# Patient Record
Sex: Female | Born: 1935 | Race: White | Hispanic: No | Smoking: Former smoker
Health system: Southern US, Community
[De-identification: ages and names within clinical notes are randomized; demographics above are authoritative.]

## PROBLEM LIST (undated history)

## (undated) DIAGNOSIS — I10 Essential (primary) hypertension: Secondary | ICD-10-CM

## (undated) DIAGNOSIS — Z972 Presence of dental prosthetic device (complete) (partial): Secondary | ICD-10-CM

## (undated) DIAGNOSIS — N289 Disorder of kidney and ureter, unspecified: Secondary | ICD-10-CM

## (undated) DIAGNOSIS — M1990 Unspecified osteoarthritis, unspecified site: Secondary | ICD-10-CM

## (undated) DIAGNOSIS — Z973 Presence of spectacles and contact lenses: Secondary | ICD-10-CM

## (undated) DIAGNOSIS — K08109 Complete loss of teeth, unspecified cause, unspecified class: Secondary | ICD-10-CM

## (undated) DIAGNOSIS — I4891 Unspecified atrial fibrillation: Secondary | ICD-10-CM

## (undated) DIAGNOSIS — E7800 Pure hypercholesterolemia, unspecified: Secondary | ICD-10-CM

## (undated) HISTORY — PX: APPENDECTOMY: SHX54

## (undated) HISTORY — PX: ABDOMINAL HYSTERECTOMY: SHX81

## (undated) HISTORY — PX: EYE SURGERY: SHX253

## (undated) HISTORY — PX: CATARACT EXTRACTION: SUR2

## (undated) HISTORY — PX: TONSILLECTOMY: SUR1361

---

## 1983-05-24 HISTORY — PX: CARPAL TUNNEL RELEASE: SHX101

## 1984-05-23 HISTORY — PX: ORIF FEMUR FRACTURE: SHX2119

## 1984-05-23 HISTORY — PX: ORIF RADIUS & ULNA FRACTURES: SHX2129

## 2007-02-26 ENCOUNTER — Ambulatory Visit (HOSPITAL_COMMUNITY): Admission: RE | Admit: 2007-02-26 | Discharge: 2007-02-26 | Payer: Self-pay | Admitting: Ophthalmology

## 2008-12-23 ENCOUNTER — Ambulatory Visit (HOSPITAL_COMMUNITY): Admission: RE | Admit: 2008-12-23 | Discharge: 2008-12-24 | Payer: Self-pay | Admitting: Family Medicine

## 2008-12-26 ENCOUNTER — Ambulatory Visit: Payer: Self-pay | Admitting: Cardiology

## 2009-06-14 ENCOUNTER — Emergency Department (HOSPITAL_COMMUNITY): Admission: EM | Admit: 2009-06-14 | Discharge: 2009-06-14 | Payer: Self-pay | Admitting: Emergency Medicine

## 2009-12-22 ENCOUNTER — Emergency Department (HOSPITAL_COMMUNITY)
Admission: EM | Admit: 2009-12-22 | Discharge: 2009-12-22 | Payer: Self-pay | Source: Home / Self Care | Admitting: Emergency Medicine

## 2010-08-06 LAB — APTT: aPTT: 34 seconds (ref 24–37)

## 2010-08-06 LAB — PROTIME-INR: Prothrombin Time: 25.7 seconds — ABNORMAL HIGH (ref 11.6–15.2)

## 2010-10-05 NOTE — Op Note (Signed)
NAMESANORA, Cynthia James          ACCOUNT NO.:  [id number]   MEDICAL RECORD NO.:  [id number]          PATIENT TYPE:  AMB   LOCATION:  SDS                          FACILITY:  [facility]   PHYSICIAN:  Jonetta Speak, M.D. DATE OF BIRTH:  Mar 21, 1930   DATE OF PROCEDURE:  02/26/2007  DATE OF DISCHARGE:                               OPERATIVE REPORT   PREOPERATIVE DIAGNOSIS:  Retained lens material, right eye with  increased intraocular pressure.   POSTOPERATIVE DIAGNOSIS:  Retained lens material, right eye with  increased intraocular pressure.   OPERATION/PROCEDURE:  Pars plana vitrectomy and phacoemulsification of  retained nuclear and cortical lens fragments.   SURGEON:  Jonetta Speak, M.D.   ASSISTANT:  Duane Lope, LPN.   ANESTHESIA:  General tracheal.   ESTIMATED BLOOD LOSS:  Less than 1 mL.   COMPLICATIONS:  None.   OPERATIVE NOTE:  The patient was taken to the operating room where after  induction of general anesthesia, the right eye was prepped and draped in  the  usual fashion.  Lid speculum was introduced and conjunctival probe  was developed over the superior 270 degrees.  Hemostasis obtained with  eraser cautery.  Sclerotomies were fashioned 3 mm posterior to the  limbus at 1:30, 10:30 and 7:30.  The superior sclerotomies were plugged  and 4 mm infusion cannula secured with 7:30 sclerotomy with a temporary  suture of 7-0 Vicryl.  The tip of the infusion cannula was visually  inspected and found to be in good position.  Landers ring was then  secured to the globe with 7-0 Vicryl sutures at 3 and 9.  The plugs were  removed and 30-degree prismatic lens applied to the surface of the eye.  Using a vitrector, central core followed by peripheral vitrectomy was  performed.  There were several nuclear phlegmons, both small and large,  with surrounding cortex in the inferior vitreous cavity.  The vitrector  was used to shave off the vitreous from around these fragments.   The  fragments were seen just slight posteriorly.  Scleral depression was  used to further trim the inferior vitreous break.  No retinal breaks or  iatrogenic breaks or tears were seen.  The IOL was inspected and found  be in good position with some cortex in the peripheral bag.  A small  amount of peripheral cortex was removed with the vitrector.  Care was  made not to interrupt the integrity of the anterior capsule and risk  dislocating the IOL.  A flat lens then applied to the surface.  Using  the phaco, the fragment was removed, the nuclear fragments, both small  and moderate size, were able to be phacoemulsified and removed from the  eye without difficulty.  The 30-degree prismatic lens was reapplied to  surface.  Inspection of the peripheral retina revealed there to be no  additional lens fragments.  The vitreous was checked and found to be  separated with no suggestion of the posterior segment.  Instruments were  removed from the eye and the  Small holes plugged.  Landers lens ring  removed.  Ophthalmoscope and scleral depression  350 degrees revealed  there to be no peripheral retinal breaks or tears.  The superior  sclerotomies were then closed with 7-0 Vicryl.  The infusion cannulas  removed and preplaced sutures secured.  Conjunctiva was drawn and  reapproximated with interrupted running suture of 6-0 plain gut.  The  pressure checked Baer keratometer at 63.  Subconjunctival space was  irrigated with 0.75% Marcaine followed by  subconjunctival injection of 100 mg of 92 mg of ceftazidime and 10 mg of  Decadron.  The lid speculum then removed and mixed antibiotic ointment  applied to the surface.  Patch and shield were placed over the patient's  right eye.  Upon waking from anesthesia, the patient left the operating  in stable condition.           ______________________________  Jonetta Speak, M.D.     JTH/MEDQ  D:  02/26/2007  T:  02/26/2007  Job:  [id number]   cc:   Dr.  __________

## 2011-03-03 LAB — DIFFERENTIAL
Basophils Absolute: 0.1
Eosinophils Absolute: 0.2
Lymphocytes Relative: 31
Lymphs Abs: 1.6
Monocytes Absolute: 0.5
Monocytes Relative: 10
Neutro Abs: 2.8
Neutrophils Relative %: 54

## 2011-03-03 LAB — COMPREHENSIVE METABOLIC PANEL
AST: 18
BUN: 61 — ABNORMAL HIGH
CO2: 21
Calcium: 8.3 — ABNORMAL LOW
Creatinine, Ser: 2.17 — ABNORMAL HIGH
GFR calc Af Amer: 27 — ABNORMAL LOW
Glucose, Bld: 131 — ABNORMAL HIGH
Total Protein: 6.3

## 2011-03-03 LAB — URINE MICROSCOPIC-ADD ON

## 2011-03-03 LAB — URINALYSIS, ROUTINE W REFLEX MICROSCOPIC
Glucose, UA: NEGATIVE
Hgb urine dipstick: NEGATIVE
Ketones, ur: NEGATIVE
Specific Gravity, Urine: 1.016
Urobilinogen, UA: 0.2

## 2011-03-03 LAB — CBC
HCT: 29.5 — ABNORMAL LOW
Platelets: 260
RBC: 3.25 — ABNORMAL LOW
WBC: 5.3

## 2011-08-11 ENCOUNTER — Encounter (HOSPITAL_COMMUNITY): Payer: Self-pay | Admitting: Emergency Medicine

## 2011-08-11 ENCOUNTER — Other Ambulatory Visit: Payer: Self-pay

## 2011-08-11 ENCOUNTER — Emergency Department (HOSPITAL_COMMUNITY)
Admission: EM | Admit: 2011-08-11 | Discharge: 2011-08-11 | Disposition: A | Discharge status: Home / Self Care | Payer: Medicare Other | Attending: Emergency Medicine | Admitting: Emergency Medicine

## 2011-08-11 ENCOUNTER — Emergency Department (HOSPITAL_COMMUNITY): Payer: Medicare Other

## 2011-08-11 DIAGNOSIS — R0989 Other specified symptoms and signs involving the circulatory and respiratory systems: Secondary | ICD-10-CM | POA: Insufficient documentation

## 2011-08-11 DIAGNOSIS — R079 Chest pain, unspecified: Secondary | ICD-10-CM | POA: Insufficient documentation

## 2011-08-11 DIAGNOSIS — Z79899 Other long term (current) drug therapy: Secondary | ICD-10-CM | POA: Insufficient documentation

## 2011-08-11 DIAGNOSIS — R0609 Other forms of dyspnea: Secondary | ICD-10-CM | POA: Insufficient documentation

## 2011-08-11 DIAGNOSIS — I499 Cardiac arrhythmia, unspecified: Secondary | ICD-10-CM | POA: Insufficient documentation

## 2011-08-11 HISTORY — DX: Unspecified atrial fibrillation: I48.91

## 2011-08-11 HISTORY — DX: Pure hypercholesterolemia, unspecified: E78.00

## 2011-08-11 HISTORY — DX: Essential (primary) hypertension: I10

## 2011-08-11 HISTORY — DX: Unspecified osteoarthritis, unspecified site: M19.90

## 2011-08-11 HISTORY — DX: Disorder of kidney and ureter, unspecified: N28.9

## 2011-08-11 LAB — CBC
HCT: 38.2 % (ref 36.0–46.0)
Hemoglobin: 12.5 g/dL (ref 12.0–15.0)
MCH: 29.8 pg (ref 26.0–34.0)
MCHC: 32.7 g/dL (ref 30.0–36.0)
MCV: 91 fL (ref 78.0–100.0)
Platelets: 298 10*3/uL (ref 150–400)
RBC: 4.2 MIL/uL (ref 3.87–5.11)
RDW: 14.2 % (ref 11.5–15.5)
WBC: 9.3 10*3/uL (ref 4.0–10.5)

## 2011-08-11 LAB — BASIC METABOLIC PANEL
BUN: 36 mg/dL — ABNORMAL HIGH (ref 6–23)
CO2: 24 mEq/L (ref 19–32)
Calcium: 9.5 mg/dL (ref 8.4–10.5)
Chloride: 104 mEq/L (ref 96–112)
Creatinine, Ser: 1.6 mg/dL — ABNORMAL HIGH (ref 0.50–1.10)
GFR calc Af Amer: 34 mL/min — ABNORMAL LOW (ref >90)
GFR calc non Af Amer: 30 mL/min — ABNORMAL LOW (ref >90)
Glucose, Bld: 80 mg/dL (ref 70–99)
Potassium: 4.2 mEq/L (ref 3.5–5.1)
Sodium: 140 mEq/L (ref 135–145)

## 2011-08-11 LAB — POCT I-STAT TROPONIN I: Troponin i, poc: 0 ng/mL (ref 0.00–0.08)

## 2011-08-11 MED ORDER — ASPIRIN 81 MG PO CHEW
324.0000 mg | CHEWABLE_TABLET | Freq: Once | ORAL | Status: DC
  Filled 2011-08-11: qty 4

## 2011-08-11 NOTE — ED Provider Notes (Signed)
History    78 year old female referred to the ER for evaluation of chest pain. Patient states she was at  her doctor's office today patient because she was having some difficulty with her at home INR machine. Patient was in the office when she was asked how she was doing to which she replied that she has not been feeling well recently. She explained that she's been having some intermittent chest pain as well. Onset several weeks ago. Pain is intermittent in the center her chest. Describes as sharp. Lasts seconds.  Does not radiate. Patient is also noticed that she sometimes gets the pain when she moves her left shoulder when she's driving her car. No other appreciable exacerbating relieving factors. No shortness of breath. No nausea, diaphoresis or palpitations. Patient has a history of atrial fibrillation which she is on coumadin.  INR checked today and 2.8. She is also hypertensive and has history of hyperlipidemia. Former smoker.   CSN: [id number]  Arrival date & time 08/11/11  23   First MD Initiated Contact with Patient 08/11/11 1605      Chief Complaint  Patient presents with  . Chest Pain    (Consider location/radiation/quality/duration/timing/severity/associated sxs/prior treatment) HPI  Past Medical History  Diagnosis Date  . Arthritis   . Diabetes mellitus   . Hypertension   . Renal disorder   . Osteoporosis   . High cholesterol   . A-fib     Past Surgical History  Procedure Date  . Tonsillectomy   . Cataract extraction     bilateral  . Abdominal hysterectomy   . Orif femur fracture   . Orif radius & ulna fractures     right  . Carpal tunnel release     Family History  Problem Relation Age of Onset  . Diabetes Mother   . Heart failure Father   . Diabetes Brother   . Cancer Brother     History  Substance Use Topics  . Smoking status: Former Smoker -- 1.5 packs/day for 20 years    Types: Cigarettes    Quit date: 08/11/1994  . Smokeless tobacco: Never  Used  . Alcohol Use: No    OB History    Grav Para Term Preterm Abortions TAB SAB Ect Mult Living   4 3 3  1  1   3       Review of Systems  Review of symptoms negative unless otherwise noted in HPI.   Allergies  Codeine  Home Medications  No current outpatient prescriptions on file.  BP 157/73  Pulse 80  Temp(Src) 97.9 F (36.6 C) (Oral)  Resp 20  Ht 4\' 11"  (1.499 m)  Wt 196 lb (88.905 kg)  BMI 39.59 kg/m2  SpO2 99%  Physical Exam  Nursing note and vitals reviewed. Constitutional: She appears well-developed and well-nourished. No distress.       obese  HENT:  Head: Normocephalic and atraumatic.  Eyes: Conjunctivae are normal. Pupils are equal, round, and reactive to light. Right eye exhibits no discharge. Left eye exhibits no discharge.  Neck: Neck supple.  Cardiovascular: Normal heart sounds.  Exam reveals no gallop and no friction rub.   No murmur heard.      Irregularly irregular  Pulmonary/Chest: Effort normal and breath sounds normal. No respiratory distress.  Abdominal: Soft. She exhibits no distension. There is no tenderness.  Musculoskeletal: She exhibits no edema and no tenderness.  Neurological: She is alert.  Skin: Skin is warm. She is not diaphoretic.  Psychiatric: She  has a normal mood and affect. Her behavior is normal. Thought content normal.    ED Course  Procedures (including critical care time)  Labs Reviewed  BASIC METABOLIC PANEL - Abnormal; Notable for the following:    BUN 36 (*)    Creatinine, Ser 1.60 (*)    GFR calc non Af Amer 30 (*)    GFR calc Af Amer 34 (*)    All other components within normal limits  CBC  POCT I-STAT TROPONIN I  LAB REPORT - SCANNED   No results found.  EKG:  Rhythm: a fib Rate: 80 Axis:normal Intervals: aside from fib, normal ST segments: normal Comparison: None   1. Chest pain       MDM  -year-old female chest pain. Consider ACS but doubt, although patient does have multiple risk factors  for coronary artery disease. Pain is atypical given very brief duration of several seconds and sometimes elicited with movement. EKG with atrial fibrillation but patient has a history of this. There are no significant ST changes. Troponin is normal. CBC is unremarkable. Creatinine is elevated but suspect that this is somewhat chronic in nature. Improved from prior comparison. No infectious symptoms. Consider pulmonary embolism but doubt. Low suspicion for urgent or emergent etiology of patient's complaints. Patient is safe for discharge at this time. Return precautions were discussed. Outpatient followup.       Virgel Manifold, MD 08/26/11 [phone]

## 2011-08-11 NOTE — ED Notes (Signed)
Patient doesn't want to take aspirin due to her taking coumadin. Per patient INR was 2.8 today. EDP made aware-aspirin discontinued.

## 2011-08-11 NOTE — Discharge Instructions (Signed)
Chest Pain (Nonspecific) It is often hard to give a specific diagnosis for the cause of chest pain. There is always a chance that your pain could be related to something serious, such as a heart attack or a blood clot in the lungs. You need to follow up with your caregiver for further evaluation. CAUSES   Heartburn.   Pneumonia or bronchitis.   Anxiety or stress.   Inflammation around your heart (pericarditis) or lung (pleuritis or pleurisy).   A blood clot in the lung.   A collapsed lung (pneumothorax). It can develop suddenly on its own (spontaneous pneumothorax) or from injury (trauma) to the chest.   Shingles infection (herpes zoster virus).  The chest wall is composed of bones, muscles, and cartilage. Any of these can be the source of the pain.  The bones can be bruised by injury.   The muscles or cartilage can be strained by coughing or overwork.   The cartilage can be affected by inflammation and become sore (costochondritis).  DIAGNOSIS  Lab tests or other studies, such as X-rays, electrocardiography, stress testing, or cardiac imaging, may be needed to find the cause of your pain.  TREATMENT   Treatment depends on what may be causing your chest pain. Treatment may include:   Acid blockers for heartburn.   Anti-inflammatory medicine.   Pain medicine for inflammatory conditions.   Antibiotics if an infection is present.   You may be advised to change lifestyle habits. This includes stopping smoking and avoiding alcohol, caffeine, and chocolate.   You may be advised to keep your head raised (elevated) when sleeping. This reduces the chance of acid going backward from your stomach into your esophagus.   Most of the time, nonspecific chest pain will improve within 2 to 3 days with rest and mild pain medicine.  HOME CARE INSTRUCTIONS   If antibiotics were prescribed, take your antibiotics as directed. Finish them even if you start to feel better.   For the next few  days, avoid physical activities that bring on chest pain. Continue physical activities as directed.   Do not smoke.   Avoid drinking alcohol.   Only take over-the-counter or prescription medicine for pain, discomfort, or fever as directed by your caregiver.   Follow your caregiver's suggestions for further testing if your chest pain does not go away.   Keep any follow-up appointments you made. If you do not go to an appointment, you could develop lasting (chronic) problems with pain. If there is any problem keeping an appointment, you must call to reschedule.  SEEK MEDICAL CARE IF:   You think you are having problems from the medicine you are taking. Read your medicine instructions carefully.   Your chest pain does not go away, even after treatment.   You develop a rash with blisters on your chest.  SEEK IMMEDIATE MEDICAL CARE IF:   You have increased chest pain or pain that spreads to your arm, neck, jaw, back, or abdomen.   You develop shortness of breath, an increasing cough, or you are coughing up blood.   You have severe back or abdominal pain, feel nauseous, or vomit.   You develop severe weakness, fainting, or chills.   You have a fever.  THIS IS AN EMERGENCY. Do not wait to see if the pain will go away. Get medical help at once. Call your local emergency services (911 in [place] Do not drive yourself to the hospital. MAKE SURE YOU:   Understand these instructions.     Will watch your condition.   Will get help right away if you are not doing well or get worse.  Document Released: 02/16/2005 Document Revised: 04/28/2011 Document Reviewed: 12/13/2007 [employer] Patient Information 2012 [employer]. 

## 2011-08-11 NOTE — ED Notes (Signed)
Patient in via EMS from [facility] for nonradiating left sided chest pain. Patient states "I just felt bad today and mentioned I had chest pain off and on and they sent me over here." Patient reports some shortness of breath, dizziness or nausea.

## 2012-08-15 ENCOUNTER — Emergency Department (HOSPITAL_COMMUNITY)
Admission: EM | Admit: 2012-08-15 | Discharge: 2012-08-15 | Disposition: A | Discharge status: Home / Self Care | Payer: Medicare Other | Attending: Emergency Medicine | Admitting: Emergency Medicine

## 2012-08-15 ENCOUNTER — Encounter (HOSPITAL_COMMUNITY): Payer: Self-pay | Admitting: Emergency Medicine

## 2012-08-15 ENCOUNTER — Emergency Department (HOSPITAL_COMMUNITY): Payer: Medicare Other

## 2012-08-15 DIAGNOSIS — M7989 Other specified soft tissue disorders: Secondary | ICD-10-CM | POA: Insufficient documentation

## 2012-08-15 DIAGNOSIS — R0602 Shortness of breath: Secondary | ICD-10-CM | POA: Insufficient documentation

## 2012-08-15 DIAGNOSIS — Z79899 Other long term (current) drug therapy: Secondary | ICD-10-CM | POA: Insufficient documentation

## 2012-08-15 DIAGNOSIS — Z87891 Personal history of nicotine dependence: Secondary | ICD-10-CM | POA: Insufficient documentation

## 2012-08-15 DIAGNOSIS — E119 Type 2 diabetes mellitus without complications: Secondary | ICD-10-CM | POA: Insufficient documentation

## 2012-08-15 DIAGNOSIS — I10 Essential (primary) hypertension: Secondary | ICD-10-CM | POA: Insufficient documentation

## 2012-08-15 DIAGNOSIS — Z7901 Long term (current) use of anticoagulants: Secondary | ICD-10-CM | POA: Insufficient documentation

## 2012-08-15 DIAGNOSIS — I4891 Unspecified atrial fibrillation: Secondary | ICD-10-CM | POA: Insufficient documentation

## 2012-08-15 DIAGNOSIS — Z8739 Personal history of other diseases of the musculoskeletal system and connective tissue: Secondary | ICD-10-CM | POA: Insufficient documentation

## 2012-08-15 DIAGNOSIS — Z87448 Personal history of other diseases of urinary system: Secondary | ICD-10-CM | POA: Insufficient documentation

## 2012-08-15 DIAGNOSIS — R079 Chest pain, unspecified: Secondary | ICD-10-CM

## 2012-08-15 DIAGNOSIS — R5381 Other malaise: Secondary | ICD-10-CM | POA: Insufficient documentation

## 2012-08-15 DIAGNOSIS — E7800 Pure hypercholesterolemia, unspecified: Secondary | ICD-10-CM | POA: Insufficient documentation

## 2012-08-15 LAB — BASIC METABOLIC PANEL
CO2: 27 mEq/L (ref 19–32)
Chloride: 105 mEq/L (ref 96–112)
Creatinine, Ser: 1.38 mg/dL — ABNORMAL HIGH (ref 0.50–1.10)

## 2012-08-15 LAB — CBC
Hemoglobin: 12.9 g/dL (ref 12.0–15.0)
MCHC: 32.6 g/dL (ref 30.0–36.0)
Platelets: 266 10*3/uL (ref 150–400)
RDW: 13.2 % (ref 11.5–15.5)

## 2012-08-15 LAB — GLUCOSE, CAPILLARY: Glucose-Capillary: 190 mg/dL — ABNORMAL HIGH (ref 70–99)

## 2012-08-15 LAB — PROTIME-INR
INR: 2.14 — ABNORMAL HIGH (ref 0.00–1.49)
Prothrombin Time: 23 seconds — ABNORMAL HIGH (ref 11.6–15.2)

## 2012-08-15 LAB — TROPONIN I: Troponin I: <0.3 ng/mL (ref <0.30)

## 2012-08-15 MED ORDER — NITROGLYCERIN 0.4 MG SL SUBL
0.4000 mg | SUBLINGUAL_TABLET | Freq: Once | SUBLINGUAL | Status: DC
  Filled 2012-08-15: qty 25

## 2012-08-15 MED ORDER — ASPIRIN 325 MG PO TABS
325.0000 mg | ORAL_TABLET | Freq: Once | ORAL | Status: AC
  Administered 2012-08-15: 325 mg via ORAL
  Filled 2012-08-15: qty 1

## 2012-08-15 NOTE — ED Notes (Signed)
Pt ambulatory to restroom with steady gait with stand by assist.

## 2012-08-15 NOTE — ED Provider Notes (Signed)
History  This chart was scribed for Cynthia Morn, MD by Cynthia James, ED Scribe. This patient was seen in room APA16A/APA16A and the patient's care was started at 9:49 AM.  CSN: [id number]  Arrival date & time 08/15/12  0930   First MD Initiated Contact with Patient 08/15/12 [phone]      Chief Complaint  Patient presents with  . Irregular Heart Beat  . Weakness    The history is provided by the patient. No language interpreter was used.    Cynthia James is a 79 y.o. female who presents to the Emergency Department complaining of one week of gradual onset, gradually worsening, constant fatigue described as having no energy with associated chest pain described as tight and heavy and SOB with exertion. She denies that the chest pain becomes worse with exertion and denies having problems laying in the supine position. Pt states that she has been "making myself get out of bed but I didn't want to this morning" causing her daughter to bring her to her PCP's for evaluation. She was then told at her PCP's that she was in A. Fib and was told to come to the ED for evaluation. She reports having a h/o occasional A. Fib and is currently on warfarin, started 5 years ago. She denies missing any missed warfarin doses. She reports prior stress tests, last one was one year ago at [facility] but denies any prior cardiac catheterizations. She reports leg swelling in the right leg which is normal for her but denies having a h/o CHF. She denies any nausea, emesis, diarrhea, fevers, cough, chills, melena and hematochezia as associated symptoms. Daughter denies any new confusion and reports that the pt is at baseline. Pt also has a h/o DM and HLD and is a former smoker but denies alcohol use.  Pt has a Film/video editor through [facility] but is unable to remember the name.    Past Medical History  Diagnosis Date  . Arthritis   . Diabetes mellitus   . Hypertension   . Renal disorder   .  Osteoporosis   . High cholesterol   . A-fib     Past Surgical History  Procedure Laterality Date  . Tonsillectomy    . Cataract extraction      bilateral  . Abdominal hysterectomy    . Orif femur fracture    . Orif radius & ulna fractures      right  . Carpal tunnel release      Family History  Problem Relation Age of Onset  . Diabetes Mother   . Heart failure Father   . Diabetes Brother   . Cancer Brother     History  Substance Use Topics  . Smoking status: Former Smoker -- 1.50 packs/day for 20 years    Types: Cigarettes    Quit date: 08/11/1994  . Smokeless tobacco: Never Used  . Alcohol Use: No    OB History   Grav Para Term Preterm Abortions TAB SAB Ect Mult Living   4 3 3  1  1   3       Review of Systems  A complete 10 system review of systems was obtained and all systems are negative except as noted in the HPI and PMH.   Allergies  Codeine  Home Medications   Current Outpatient Rx  Name  Route  Sig  Dispense  Refill  . brimonidine (ALPHAGAN) 0.2 % ophthalmic solution   Right Eye  Place 1 drop into the right eye 2 (two) times daily.         Marland Kitchen diltiazem (DILACOR XR) 240 MG 24 hr capsule   Oral   Take 240 mg by mouth every morning.          Marland Kitchen glimepiride (AMARYL) 4 MG tablet   Oral   Take 4 mg by mouth daily.         Marland Kitchen lisinopril-hydrochlorothiazide (PRINZIDE,ZESTORETIC) 20-12.5 MG per tablet   Oral   Take 1 tablet by mouth 2 (two) times daily.          . pravastatin (PRAVACHOL) 20 MG tablet   Oral   Take 20 mg by mouth at bedtime.         Marland Kitchen warfarin (COUMADIN) 5 MG tablet   Oral   Take 5 mg by mouth daily. TAKE ONE-HALF TABLET (2.5MG ) ON MONDAYS AND FRIDAYS ONLY, THEN TAKE ONE TABLET (5MG ) ON ALL OTHER DAYS           Triage Vitals: BP 138/76  Temp(Src) 98.6 F (37 C) (Oral)  Resp 20  Ht 5' (1.524 m)  Wt 202 lb (91.627 kg)  BMI 39.45 kg/m2  SpO2 96%  Physical Exam  Nursing note and vitals  reviewed. Constitutional: She is oriented to person, place, and time. She appears well-developed and well-nourished. No distress.  HENT:  Head: Normocephalic and atraumatic.  Eyes: EOM are normal.  Neck: Normal range of motion.  Cardiovascular: Normal rate and normal heart sounds.  An irregularly irregular rhythm present.  Pulmonary/Chest: Effort normal and breath sounds normal.  Abdominal: Soft. She exhibits no distension. There is no tenderness.  Musculoskeletal: Normal range of motion.  Neurological: She is alert and oriented to person, place, and time.  Skin: Skin is warm and dry.  Psychiatric: She has a normal mood and affect. Judgment normal.    ED Course  Procedures (including critical care time)  DIAGNOSTIC STUDIES: Oxygen Saturation is 96% on room air, adequate by my interpretation.    COORDINATION OF CARE: 10:05 AM-Discussed treatment plan which includes CXR, CBC panel and troponin with pt at bedside and pt agreed to plan.   Labs Reviewed  BASIC METABOLIC PANEL - Abnormal; Notable for the following:    Glucose, Bld 215 (*)    Creatinine, Ser 1.38 (*)    GFR calc non Af Amer 35 (*)    GFR calc Af Amer 41 (*)    All other components within normal limits  PRO B NATRIURETIC PEPTIDE - Abnormal; Notable for the following:    Pro B Natriuretic peptide (BNP) 1867.0 (*)    All other components within normal limits  PROTIME-INR - Abnormal; Notable for the following:    Prothrombin Time 23.0 (*)    INR 2.14 (*)    All other components within normal limits  GLUCOSE, CAPILLARY - Abnormal; Notable for the following:    Glucose-Capillary 190 (*)    All other components within normal limits  CBC  TROPONIN I   Dg Chest 2 View  08/15/2012  *RADIOLOGY REPORT*  Clinical Data: Irregular heart rate, weakness.  CHEST - 2 VIEW  Comparison: 08/11/2011.  Findings: Trachea is midline.  Heart size normal.  Lungs are clear. No pleural fluid.  Degenerative changes are seen in the mid  thoracic spine.  IMPRESSION: No acute findings.   Original Report Authenticated By: Lorin Picket, M.D.      1. Chest pain   2. Atrial fibrillation  MDM  The patient has no complaints at this time.  She is chest pain-free.  She's been up and ambulatory in the emergency department.  Her heart rate stayed in the 70s and 80s this whole time.  She's therapeutic on her Coumadin.  She is rate controlled in atrial fibrillation at this time.  Suspect she's been in A. fib for least one to 2 weeks consistently.  Her BNP is mildly elevated at 1800 hours she has no significant vascular congestion or signs of congestive heart failure.  She has no orthopnea.  I spoke with cardiology Dr. wall who agrees as long as the patient is stable that she should be a little be discharged safely from the emergency department with close cardiology followup.  She is scheduled a followup as a new appointment on April 2 2:40 PM here in the regional office.  The patient understands this and is agreeable to outpatient plan.  She understands to return to the ER for new or worsening symptoms  I personally performed the services described in this documentation, which was scribed in my presence. The recorded information has been reviewed and is accurate.          Cynthia Morn, MD 08/15/12 [phone]

## 2012-08-15 NOTE — ED Notes (Signed)
Pt states has not been feeling well for a week or more. Pt states tired all the time, chest tightness but denies pain. Went to PCP today and was told she was in afib and reported to come to ED for evaluation

## 2012-08-22 ENCOUNTER — Encounter: Payer: Self-pay | Admitting: Adult Health

## 2012-08-22 ENCOUNTER — Ambulatory Visit (INDEPENDENT_AMBULATORY_CARE_PROVIDER_SITE_OTHER): Payer: Medicare Other | Admitting: Adult Health

## 2012-08-22 VITALS — BP 122/74 | HR 75 | Ht 60.0 in | Wt 199.2 lb

## 2012-08-22 DIAGNOSIS — E7800 Pure hypercholesterolemia, unspecified: Secondary | ICD-10-CM | POA: Insufficient documentation

## 2012-08-22 DIAGNOSIS — E119 Type 2 diabetes mellitus without complications: Secondary | ICD-10-CM | POA: Insufficient documentation

## 2012-08-22 DIAGNOSIS — I4891 Unspecified atrial fibrillation: Secondary | ICD-10-CM | POA: Insufficient documentation

## 2012-08-22 DIAGNOSIS — I10 Essential (primary) hypertension: Secondary | ICD-10-CM | POA: Insufficient documentation

## 2012-08-22 NOTE — Progress Notes (Signed)
HPI: Cynthia James is an 79 year old patient who presents to our office today post ED visit for chest pain. She has a history of paroxysmal atrial fibrillation on Coumadin. He was previously followed by [facility] but denied any prior cardiac catheterizations. She has a history of diabetes hypertension renal disorder and hypercholesterolemia. ACS was ruled out during evaluation in the ER and she is here for cardiology followup.   She is normally followed by Dr. Joni Fears, [facility], who sees her in the antibiotic. Unfortunately he is unable to see her until May 1. There was concerns that she should be seen sooner due to recent ER visit. She continues to have periods for her heart is racing, usually lasting around 10 minutes at a time. Remains on Coumadin, and has recently been changed to verapamil from diltiazem due cost of medication. She denies chest pain, dizziness,or dyspnea on exertion.   Allergies  Allergen Reactions  . Codeine Nausea And Vomiting    Current Outpatient Prescriptions  Medication Sig Dispense Refill  . acetaminophen (TYLENOL) 500 MG tablet Take 1,000 mg by mouth every 6 (six) hours as needed for pain.      Marland Kitchen diltiazem (DILACOR XR) 240 MG 24 hr capsule Take 240 mg by mouth every morning.       . insulin glargine (LANTUS) 100 UNIT/ML injection Inject 20 Units into the skin at bedtime.      Marland Kitchen LEVEMIR FLEXPEN 100 UNIT/ML injection       . losartan-hydrochlorothiazide (HYZAAR) 50-12.5 MG per tablet Take 1 tablet by mouth daily.      . pravastatin (PRAVACHOL) 20 MG tablet Take 20 mg by mouth at bedtime.      Marland Kitchen tobramycin (TOBREX) 0.3 % ophthalmic solution Place 1 drop into both eyes daily.      . traMADol (ULTRAM) 50 MG tablet       . warfarin (COUMADIN) 5 MG tablet Take 5 mg by mouth daily.        No current facility-administered medications for this visit.    Past Medical History  Diagnosis Date  . Arthritis   . Diabetes mellitus   . Hypertension   . Renal  disorder   . Osteoporosis   . High cholesterol   . A-fib     Past Surgical History  Procedure Laterality Date  . Tonsillectomy    . Cataract extraction      bilateral  . Abdominal hysterectomy    . Orif femur fracture    . Orif radius & ulna fractures      right  . Carpal tunnel release      Family History  Problem Relation Age of Onset  . Diabetes Mother   . Heart failure Father   . Diabetes Brother   . Cancer Brother     History   Social History  . Marital Status: Widowed    Spouse Name: N/A    Number of Children: N/A  . Years of Education: N/A   Occupational History  . Not on file.   Social History Main Topics  . Smoking status: Former Smoker -- 1.50 packs/day for 20 years    Types: Cigarettes    Quit date: 08/11/1994  . Smokeless tobacco: Never Used  . Alcohol Use: No  . Drug Use: No  . Sexually Active: Not Currently    Birth Control/ Protection: Surgical   Other Topics Concern  . Not on file   Social History Narrative  . No narrative on  file    [id number] of systems complete and found to be negative unless listed above   PHYSICAL EXAM BP 122/74  Pulse 75  Ht 5' (1.524 m)  Wt 199 lb 4 oz (90.379 kg)  BMI 38.91 kg/m2  .General: Well developed, well nourished, in no acute distress Head: Eyes PERRLA, No xanthomas.   Normal cephalic and atramatic  Lungs: Clear bilaterally to auscultation and percussion.Obese. Heart: HRIR S1 S2, without MRG.  Pulses are 2+ & equal.            No carotid bruit. No JVD.  No abdominal bruits. No femoral bruits. Abdomen: Bowel sounds are positive, abdomen soft and non-tender without masses or                  Hernia's noted. Msk:  Back normal, normal gait. Normal strength and tone for age. Extremities: No clubbing, cyanosis or edema.  DP +1 Neuro: Alert and oriented X 3. Psych:  Good affect, responds appropriately  [id number] fibrillation rate of  92 bpm.   ASSESSMENT AND PLAN

## 2012-08-22 NOTE — Assessment & Plan Note (Signed)
Ongoing risk management and low-cholesterol diet is recommended.

## 2012-08-22 NOTE — Progress Notes (Deleted)
Name: Cynthia James    DOB: 1929-09-10  Age: 79 y.o.  MR#: [id number]       PCP:  Geroge Baseman      Insurance: Payor: [employer] MEDICARE  Plan: [place]  Product Type: *No Product type*    CC:    Chief Complaint  Patient presents with  . Chest Pain    S/P ER visit   PT NOTES SHE IS STILL CURRENTLY TAKING THE DIALTEZEM HOWEVER WAS ADVISED BY PCP TO CHANGE TO VERATAMIL PER TOO EXPENSIVE, CALLED INTO PT MAIL ORDER [facility] RX VS Filed Vitals:   08/22/12 1501  BP: 122/74  Pulse: 75  Height: 5' (1.524 m)  Weight: 199 lb 4 oz (90.379 kg)    Weights Current Weight  08/22/12 199 lb 4 oz (90.379 kg)  08/15/12 202 lb (91.627 kg)  08/11/11 196 lb (88.905 kg)    Blood Pressure  BP Readings from Last 3 Encounters:  08/22/12 122/74  08/15/12 139/74  08/11/11 157/73     Admit date:  (Not on file) Last encounter with RMR:  Visit date not found   Allergy Codeine  Current Outpatient Prescriptions  Medication Sig Dispense Refill  . acetaminophen (TYLENOL) 500 MG tablet Take 1,000 mg by mouth every 6 (six) hours as needed for pain.      Marland Kitchen diltiazem (DILACOR XR) 240 MG 24 hr capsule Take 240 mg by mouth every morning.       . insulin glargine (LANTUS) 100 UNIT/ML injection Inject 20 Units into the skin at bedtime.      Marland Kitchen LEVEMIR FLEXPEN 100 UNIT/ML injection       . losartan-hydrochlorothiazide (HYZAAR) 50-12.5 MG per tablet Take 1 tablet by mouth daily.      . pravastatin (PRAVACHOL) 20 MG tablet Take 20 mg by mouth at bedtime.      Marland Kitchen tobramycin (TOBREX) 0.3 % ophthalmic solution Place 1 drop into both eyes daily.      . traMADol (ULTRAM) 50 MG tablet       . warfarin (COUMADIN) 5 MG tablet Take 5 mg by mouth daily.        No current facility-administered medications for this visit.    Discontinued Meds:   There are no discontinued medications.  There is no problem list on file for this patient.   LABS    Component Value Date/Time   NA 139  08/15/2012 1003   NA 140 08/11/2011 1615   NA 141 02/26/2007 0845   K 3.7 08/15/2012 1003   K 4.2 08/11/2011 1615   K 4.5 HEMOLYZED SPECIMEN, RESULTS MAY BE AFFECTED 02/26/2007 0845   CL 105 08/15/2012 1003   CL 104 08/11/2011 1615   CL 113* 02/26/2007 0845   CO2 27 08/15/2012 1003   CO2 24 08/11/2011 1615   CO2 21 02/26/2007 0845   GLUCOSE 215* 08/15/2012 1003   GLUCOSE 80 08/11/2011 1615   GLUCOSE 131* 02/26/2007 0845   BUN 19 08/15/2012 1003   BUN 36* 08/11/2011 1615   BUN 61* 02/26/2007 0845   CREATININE 1.38* 08/15/2012 1003   CREATININE 1.60* 08/11/2011 1615   CREATININE 2.17* 02/26/2007 0845   CALCIUM 8.8 08/15/2012 1003   CALCIUM 9.5 08/11/2011 1615   CALCIUM 8.3* 02/26/2007 0845   GFRNONAA 35* 08/15/2012 1003   GFRNONAA 30* 08/11/2011 1615   GFRNONAA 22* 02/26/2007 0845   GFRAA 41* 08/15/2012 1003   GFRAA 34* 08/11/2011 1615   GFRAA  Value: 27  The eGFR has been calculated using the MDRD equation. This calculation has not been validated in all clinical* 02/26/2007 0845   CMP     Component Value Date/Time   NA 139 08/15/2012 1003   K 3.7 08/15/2012 1003   CL 105 08/15/2012 1003   CO2 27 08/15/2012 1003   GLUCOSE 215* 08/15/2012 1003   BUN 19 08/15/2012 1003   CREATININE 1.38* 08/15/2012 1003   CALCIUM 8.8 08/15/2012 1003   PROT 6.3 02/26/2007 0845   ALBUMIN 3.5 02/26/2007 0845   AST 18 02/26/2007 0845   ALT 10 02/26/2007 0845   ALKPHOS 46 02/26/2007 0845   BILITOT 0.8 02/26/2007 0845   GFRNONAA 35* 08/15/2012 1003   GFRAA 41* 08/15/2012 1003       Component Value Date/Time   WBC 7.5 08/15/2012 1003   WBC 9.3 08/11/2011 1615   WBC 5.3 02/26/2007 0845   HGB 12.9 08/15/2012 1003   HGB 12.5 08/11/2011 1615   HGB 9.9* 02/26/2007 0845   HCT 39.6 08/15/2012 1003   HCT 38.2 08/11/2011 1615   HCT 29.5* 02/26/2007 0845   MCV 90.2 08/15/2012 1003   MCV 91.0 08/11/2011 1615   MCV 90.9 02/26/2007 0845    Lipid Panel  No results found for this basename: chol, trig, hdl, cholhdl, vldl, ldlcalc    ABG No  results found for this basename: phart, pco2, pco2art, po2, po2art, hco3, tco2, acidbasedef, o2sat     No results found for this basename: TSH   BNP (last 3 results)  Recent Labs  08/15/12 1003  PROBNP 1867.0*   Cardiac Panel (last 3 results) No results found for this basename: CKTOTAL, CKMB, TROPONINI, RELINDX,  in the last 72 hours  Iron/TIBC/Ferritin No results found for this basename: iron, tibc, ferritin     EKG Orders placed in visit on 08/22/12  . EKG 12-LEAD     Prior Assessment and Plan    Imaging: Dg Chest 2 View  08/15/2012  *RADIOLOGY REPORT*  Clinical Data: Irregular heart rate, weakness.  CHEST - 2 VIEW  Comparison: 08/11/2011.  Findings: Trachea is midline.  Heart size normal.  Lungs are clear. No pleural fluid.  Degenerative changes are seen in the mid thoracic spine.  IMPRESSION: No acute findings.   Original Report Authenticated By: Lorin Picket, M.D.

## 2012-08-22 NOTE — Assessment & Plan Note (Signed)
Excellent control of blood pressure on this visit. We will make no changes in her medication regimen as discussed above. It is noted, that she has been changed from diltiazem to verapamil due to medication cost. Scope that she will be up to see her family practice physician, and cardiologist sooner than May 1 as this is a month away, for ongoing management. She is advised to come GERD for any recurrent symptoms.

## 2012-08-22 NOTE — Patient Instructions (Addendum)
Your physician recommends that you schedule a follow-up appointment in: Follow up with your primary cardiologist

## 2012-08-22 NOTE — Assessment & Plan Note (Signed)
Mrs. read is normally seen by Dr. Joni Fears cardiologist in [place] [facility] where he travels from [facility]. Although we are always happy to see new patients, she is already est. with a cardiologist. Our refer her back to her cardiologist without making any medication changes at this time. Heart rate is well-controlled blood pressure is well-controlled. She is currently on Coumadin and is being followed by the physicians at [facility] family practice for dosing and blood work. She appears stable from a cardiovascular standpoint at this time. She admits to having some episodes of racing heart in between her doses of diltiazem. Approximately twice a week. She may benefit from additional medications to include flecainide if she has no known history of CAD. Review of records do not reveal this. However she does have multiple cardiovascular risk factors. Currently she is stable and I have educated her on the need to be medically compliant and follow up with her primary care physician for ongoing assessment and management. She verbalizes understanding.

## 2013-03-29 ENCOUNTER — Encounter (HOSPITAL_BASED_OUTPATIENT_CLINIC_OR_DEPARTMENT_OTHER): Payer: Self-pay | Admitting: *Deleted

## 2013-03-29 ENCOUNTER — Encounter (HOSPITAL_BASED_OUTPATIENT_CLINIC_OR_DEPARTMENT_OTHER)
Admission: RE | Admit: 2013-03-29 | Discharge: 2013-03-29 | Disposition: A | Discharge status: Home / Self Care | Payer: Medicare Other | Source: Ambulatory Visit | Attending: Orthopedic Surgery | Admitting: Orthopedic Surgery

## 2013-03-29 ENCOUNTER — Other Ambulatory Visit: Payer: Self-pay | Admitting: Orthopedic Surgery

## 2013-03-29 LAB — BASIC METABOLIC PANEL
Calcium: 9.3 mg/dL (ref 8.4–10.5)
Chloride: 103 mEq/L (ref 96–112)
Creatinine, Ser: 1.55 mg/dL — ABNORMAL HIGH (ref 0.50–1.10)
GFR calc Af Amer: 35 mL/min — ABNORMAL LOW (ref >90)
GFR calc non Af Amer: 30 mL/min — ABNORMAL LOW (ref >90)

## 2013-03-29 LAB — PROTIME-INR: Prothrombin Time: 20.2 seconds — ABNORMAL HIGH (ref 11.6–15.2)

## 2013-03-29 NOTE — Progress Notes (Addendum)
Notified Michel Harrow Rn of Pt's BUN 30, Creat 1.5, PT 20.2- Jeani Hawking will call Dr Leanora Cover.

## 2013-03-29 NOTE — Progress Notes (Signed)
03/29/13 1146  OBSTRUCTIVE SLEEP APNEA  Have you ever been diagnosed with sleep apnea through a sleep study? No  Do you snore loudly (loud enough to be heard through closed doors)?  1  Do you often feel tired, fatigued, or sleepy during the daytime? 0  Has anyone observed you stop breathing during your sleep? 0  Do you have, or are you being treated for high blood pressure? 1  BMI more than 35 kg/m2? 1  Age over 77 years old? 1  Neck circumference greater than 40 cm/18 inches? 0  Gender: 0  Obstructive Sleep Apnea Score 4  Score 4 or greater  Results sent to PCP

## 2013-03-29 NOTE — Progress Notes (Signed)
Here for pt ptt bmet-had ekg 4/14-

## 2013-04-01 ENCOUNTER — Encounter (HOSPITAL_BASED_OUTPATIENT_CLINIC_OR_DEPARTMENT_OTHER)
Admission: RE | Disposition: A | Discharge status: Home / Self Care | Payer: Self-pay | Source: Ambulatory Visit | Attending: Orthopedic Surgery

## 2013-04-01 ENCOUNTER — Ambulatory Visit (HOSPITAL_BASED_OUTPATIENT_CLINIC_OR_DEPARTMENT_OTHER): Payer: Medicare Other | Admitting: Anesthesiology

## 2013-04-01 ENCOUNTER — Encounter (HOSPITAL_BASED_OUTPATIENT_CLINIC_OR_DEPARTMENT_OTHER): Payer: Medicare Other | Admitting: Anesthesiology

## 2013-04-01 ENCOUNTER — Encounter (HOSPITAL_BASED_OUTPATIENT_CLINIC_OR_DEPARTMENT_OTHER): Payer: Self-pay | Admitting: *Deleted

## 2013-04-01 ENCOUNTER — Ambulatory Visit (HOSPITAL_BASED_OUTPATIENT_CLINIC_OR_DEPARTMENT_OTHER)
Admission: RE | Admit: 2013-04-01 | Discharge: 2013-04-01 | Disposition: A | Discharge status: Home / Self Care | Payer: Medicare Other | Source: Ambulatory Visit | Attending: Orthopedic Surgery | Admitting: Orthopedic Surgery

## 2013-04-01 DIAGNOSIS — E119 Type 2 diabetes mellitus without complications: Secondary | ICD-10-CM | POA: Insufficient documentation

## 2013-04-01 DIAGNOSIS — M810 Age-related osteoporosis without current pathological fracture: Secondary | ICD-10-CM | POA: Insufficient documentation

## 2013-04-01 DIAGNOSIS — I4891 Unspecified atrial fibrillation: Secondary | ICD-10-CM | POA: Insufficient documentation

## 2013-04-01 DIAGNOSIS — I10 Essential (primary) hypertension: Secondary | ICD-10-CM | POA: Insufficient documentation

## 2013-04-01 DIAGNOSIS — M6530 Trigger finger, unspecified finger: Secondary | ICD-10-CM | POA: Insufficient documentation

## 2013-04-01 DIAGNOSIS — M6740 Ganglion, unspecified site: Secondary | ICD-10-CM | POA: Insufficient documentation

## 2013-04-01 DIAGNOSIS — Z885 Allergy status to narcotic agent status: Secondary | ICD-10-CM | POA: Insufficient documentation

## 2013-04-01 DIAGNOSIS — E7800 Pure hypercholesterolemia, unspecified: Secondary | ICD-10-CM | POA: Insufficient documentation

## 2013-04-01 HISTORY — DX: Presence of dental prosthetic device (complete) (partial): K08.109

## 2013-04-01 HISTORY — DX: Presence of dental prosthetic device (complete) (partial): Z97.2

## 2013-04-01 HISTORY — DX: Presence of spectacles and contact lenses: Z97.3

## 2013-04-01 HISTORY — PX: TRIGGER FINGER RELEASE: SHX641

## 2013-04-01 SURGERY — RELEASE, A1 PULLEY, FOR TRIGGER FINGER
Anesthesia: Monitor Anesthesia Care | Site: Finger | Laterality: Right | Wound class: Clean

## 2013-04-01 MED ORDER — BUPIVACAINE HCL (PF) 0.25 % IJ SOLN
INTRAMUSCULAR | Status: AC
  Filled 2013-04-01: qty 30

## 2013-04-01 MED ORDER — PROPOFOL INFUSION 10 MG/ML OPTIME
INTRAVENOUS | Status: DC | PRN
  Administered 2013-04-01: 50 ug/kg/min via INTRAVENOUS

## 2013-04-01 MED ORDER — FENTANYL CITRATE 0.05 MG/ML IJ SOLN
INTRAMUSCULAR | Status: AC
  Filled 2013-04-01: qty 2

## 2013-04-01 MED ORDER — MIDAZOLAM HCL 2 MG/2ML IJ SOLN
INTRAMUSCULAR | Status: AC
  Filled 2013-04-01: qty 2

## 2013-04-01 MED ORDER — CEFAZOLIN SODIUM 1-5 GM-% IV SOLN
INTRAVENOUS | Status: AC
  Filled 2013-04-01: qty 100

## 2013-04-01 MED ORDER — HYDROCODONE-ACETAMINOPHEN 5-325 MG PO TABS: ORAL_TABLET | ORAL | Status: DC

## 2013-04-01 MED ORDER — MIDAZOLAM HCL 2 MG/2ML IJ SOLN: 1.0000 mg | INTRAMUSCULAR | Status: DC | PRN

## 2013-04-01 MED ORDER — BUPIVACAINE HCL (PF) 0.25 % IJ SOLN
INTRAMUSCULAR | Status: DC | PRN
  Administered 2013-04-01: 6 mL

## 2013-04-01 MED ORDER — FENTANYL CITRATE 0.05 MG/ML IJ SOLN
INTRAMUSCULAR | Status: DC | PRN
  Administered 2013-04-01: 50 ug via INTRAVENOUS

## 2013-04-01 MED ORDER — LACTATED RINGERS IV SOLN
INTRAVENOUS | Status: DC
  Administered 2013-04-01: 08:00:00 via INTRAVENOUS

## 2013-04-01 MED ORDER — MIDAZOLAM HCL 5 MG/5ML IJ SOLN
INTRAMUSCULAR | Status: DC | PRN
  Administered 2013-04-01: 1 mg via INTRAVENOUS

## 2013-04-01 MED ORDER — CEFAZOLIN SODIUM-DEXTROSE 2-3 GM-% IV SOLR
2.0000 g | INTRAVENOUS | Status: AC
  Administered 2013-04-01: 2 g via INTRAVENOUS

## 2013-04-01 MED ORDER — CHLORHEXIDINE GLUCONATE 4 % EX LIQD: 60.0000 mL | Freq: Once | CUTANEOUS | Status: DC

## 2013-04-01 MED ORDER — PROMETHAZINE HCL 25 MG/ML IJ SOLN
INTRAMUSCULAR | Status: AC
  Filled 2013-04-01: qty 1

## 2013-04-01 MED ORDER — LIDOCAINE HCL (PF) 0.5 % IJ SOLN
INTRAMUSCULAR | Status: DC | PRN
  Administered 2013-04-01: 50 mL via INTRAVENOUS

## 2013-04-01 MED ORDER — ONDANSETRON HCL 4 MG/2ML IJ SOLN
INTRAMUSCULAR | Status: DC | PRN
  Administered 2013-04-01: 4 mg via INTRAVENOUS

## 2013-04-01 MED ORDER — FENTANYL CITRATE 0.05 MG/ML IJ SOLN: 50.0000 ug | INTRAMUSCULAR | Status: DC | PRN

## 2013-04-01 MED ORDER — PROMETHAZINE HCL 25 MG/ML IJ SOLN
6.2500 mg | Freq: Four times a day (QID) | INTRAMUSCULAR | Status: DC | PRN
  Administered 2013-04-01: 6.25 mg via INTRAVENOUS

## 2013-04-01 SURGICAL SUPPLY — 32 items
BANDAGE COBAN STERILE 2 (GAUZE/BANDAGES/DRESSINGS) ×2 IMPLANT
BANDAGE CONFORM 2  STR LF (GAUZE/BANDAGES/DRESSINGS) ×2 IMPLANT
BLADE MINI RND TIP GREEN BEAV (BLADE) IMPLANT
BLADE SURG 15 STRL LF DISP TIS (BLADE) ×2 IMPLANT
BLADE SURG 15 STRL SS (BLADE) ×2
BNDG ESMARK 4X9 LF (GAUZE/BANDAGES/DRESSINGS) IMPLANT
CHLORAPREP W/TINT 26ML (MISCELLANEOUS) ×2 IMPLANT
CORDS BIPOLAR (ELECTRODE) ×2 IMPLANT
COVER MAYO STAND STRL (DRAPES) ×2 IMPLANT
COVER TABLE BACK 60X90 (DRAPES) ×2 IMPLANT
CUFF TOURNIQUET SINGLE 18IN (TOURNIQUET CUFF) ×2 IMPLANT
DRAPE EXTREMITY T 121X128X90 (DRAPE) ×2 IMPLANT
DRAPE SURG 17X23 STRL (DRAPES) ×2 IMPLANT
GAUZE XEROFORM 1X8 LF (GAUZE/BANDAGES/DRESSINGS) ×2 IMPLANT
GLOVE BIO SURGEON STRL SZ7.5 (GLOVE) ×2 IMPLANT
GLOVE BIOGEL PI IND STRL 8 (GLOVE) ×2 IMPLANT
GLOVE BIOGEL PI INDICATOR 8 (GLOVE) ×2
GLOVE SURG SS PI 8.0 STRL IVOR (GLOVE) ×2 IMPLANT
GOWN BRE IMP PREV XXLGXLNG (GOWN DISPOSABLE) ×4 IMPLANT
GOWN PREVENTION PLUS XLARGE (GOWN DISPOSABLE) ×2 IMPLANT
NEEDLE HYPO 25X1 1.5 SAFETY (NEEDLE) ×2 IMPLANT
NS IRRIG 1000ML POUR BTL (IV SOLUTION) ×2 IMPLANT
PACK BASIN DAY SURGERY FS (CUSTOM PROCEDURE TRAY) ×2 IMPLANT
PADDING CAST ABS 4INX4YD NS (CAST SUPPLIES) ×1
PADDING CAST ABS COTTON 4X4 ST (CAST SUPPLIES) ×1 IMPLANT
SPONGE GAUZE 4X4 12PLY (GAUZE/BANDAGES/DRESSINGS) ×2 IMPLANT
STOCKINETTE 4X48 STRL (DRAPES) ×2 IMPLANT
SUT ETHILON 4 0 PS 2 18 (SUTURE) ×2 IMPLANT
SYR BULB 3OZ (MISCELLANEOUS) ×2 IMPLANT
SYR CONTROL 10ML LL (SYRINGE) ×2 IMPLANT
TOWEL OR 17X24 6PK STRL BLUE (TOWEL DISPOSABLE) ×4 IMPLANT
UNDERPAD 30X30 INCONTINENT (UNDERPADS AND DIAPERS) ×2 IMPLANT

## 2013-04-01 NOTE — Anesthesia Preprocedure Evaluation (Signed)
Anesthesia Evaluation  Patient identified by MRN, date of birth, ID band Patient awake    Reviewed: Allergy & Precautions, H&P , NPO status , Patient's Chart, lab work & pertinent test results  Airway Mallampati: I TM Distance: >3 FB Neck ROM: Full    Dental  (+) Upper Dentures, Lower Dentures and Dental Advisory Given   Pulmonary  breath sounds clear to auscultation        Cardiovascular hypertension, Pt. on medications Rhythm:Regular Rate:Normal     Neuro/Psych    GI/Hepatic   Endo/Other  diabetes, Well Controlled, Type 2, Insulin DependentMorbid obesity  Renal/GU Renal diseasenegative Renal ROS     Musculoskeletal   Abdominal   Peds  Hematology   Anesthesia Other Findings   Reproductive/Obstetrics                           Anesthesia Physical Anesthesia Plan  ASA: III  Anesthesia Plan: MAC and Bier Block   Post-op Pain Management:    Induction: Intravenous  Airway Management Planned: Simple Face Mask and Natural Airway  Additional Equipment:   Intra-op Plan:   Post-operative Plan:   Informed Consent:   Dental advisory given  Plan Discussed with: CRNA, Anesthesiologist and Surgeon  Anesthesia Plan Comments:         Anesthesia Quick Evaluation

## 2013-04-01 NOTE — H&P (Signed)
Cynthia James is an 79 y.o. female.   Chief Complaint: right long trigger digit HPI: 79 yo rhd female with triggering of right long finger x 1 year.  Bothersome to her.  Has been injected x 1 with recurrence.  Pain with grip and triggering requiring other hand to straighten finger.  She wishes to have right long finger trigger release.  Past Medical History  Diagnosis Date  . Arthritis   . Diabetes mellitus   . Hypertension   . Renal disorder   . Osteoporosis   . High cholesterol   . A-fib   . Full dentures   . Wears glasses     Past Surgical History  Procedure Laterality Date  . Tonsillectomy    . Cataract extraction      bilateral  . Abdominal hysterectomy    . Orif femur fracture  1986    lt-hip  . Orif radius & ulna fractures  1986    right  . Carpal tunnel release  1985    rt/lt  . Appendectomy    . Eye surgery      Family History  Problem Relation Age of Onset  . Diabetes Mother   . Heart failure Father   . Diabetes Brother   . Cancer Brother    Social History:  reports that she quit smoking about 18 years ago. Her smoking use included Cigarettes. She has a 30 pack-year smoking history. She has never used smokeless tobacco. She reports that she does not drink alcohol or use illicit drugs.  Allergies:  Allergies  Allergen Reactions  . Codeine Nausea And Vomiting  . Percocet [Oxycodone-Acetaminophen] Nausea And Vomiting  . Tramadol Nausea And Vomiting    Medications Prior to Admission  Medication Sig Dispense Refill  . acetaminophen (TYLENOL) 500 MG tablet Take 1,000 mg by mouth every 6 (six) hours as needed for pain.      Marland Kitchen insulin glargine (LANTUS) 100 UNIT/ML injection Inject 24 Units into the skin at bedtime.       Marland Kitchen losartan-hydrochlorothiazide (HYZAAR) 50-12.5 MG per tablet Take 1 tablet by mouth daily.      . pravastatin (PRAVACHOL) 20 MG tablet Take 20 mg by mouth at bedtime.      Marland Kitchen tobramycin (TOBREX) 0.3 % ophthalmic solution Place 1 drop  into both eyes daily.      Marland Kitchen warfarin (COUMADIN) 5 MG tablet Take 5 mg by mouth daily.       Marland Kitchen diltiazem (DILACOR XR) 240 MG 24 hr capsule Take 240 mg by mouth every morning.         Results for orders placed during the hospital encounter of 04/01/13 (from the past 48 hour(s))  GLUCOSE, CAPILLARY     Status: Abnormal   Collection Time    04/01/13  7:28 AM      Result Value Range   Glucose-Capillary 119 (*) 70 - 99 mg/dL    No results found.   A comprehensive review of systems was negative except for: Eyes: positive for contacts/glasses Ears, nose, mouth, throat, and face: positive for hearing loss Respiratory: positive for shortness of breath Hematologic/lymphatic: positive for bleeding  Blood pressure 132/87, pulse 101, temperature 97.9 F (36.6 C), temperature source Oral, resp. rate 20, height 5' (1.524 m), weight 203 lb (92.08 kg).  General appearance: alert, cooperative and appears stated age Head: Normocephalic, without obvious abnormality, atraumatic Neck: supple, symmetrical, trachea midline Resp: clear to auscultation bilaterally Cardio: regular rate and rhythm GI: non tender Extremities:  intact sensation and capillary refill all digits.  +epl/fpl/io.  triggering right long finger.  no wounds. Pulses: 2+ and symmetric Skin: Skin color, texture, turgor normal. No rashes or lesions Neurologic: Grossly normal Incision/Wound: none  Assessment/Plan Right long finger trigger digit.  Non operative and operative treatment options were discussed with the patient and patient wishes to proceed with operative treatment. Risks, benefits, and alternatives of surgery were discussed and the patient agrees with the plan of care.   Marjan Rosman R 04/01/2013, 8:27 AM

## 2013-04-01 NOTE — Anesthesia Postprocedure Evaluation (Signed)
  Anesthesia Post-op Note  Patient: Cynthia James  Procedure(s) Performed: Procedure(s): RELEASE TRIGGER FINGER/A-1 PULLEY RIGHT LONG FINGER (Right)  Patient Location: PACU  Anesthesia Type:MAC and Bier block  Level of Consciousness: awake, alert  and oriented  Airway and Oxygen Therapy: Patient Spontanous Breathing  Post-op Pain: mild  Post-op Assessment: Post-op Vital signs reviewed  Post-op Vital Signs: Reviewed  Complications: No apparent anesthesia complications

## 2013-04-01 NOTE — Brief Op Note (Signed)
04/01/2013  9:11 AM  PATIENT:  Cynthia James  79 y.o. female  PRE-OPERATIVE DIAGNOSIS:  Right Long Finger Trigger Digit  POST-OPERATIVE DIAGNOSIS:  Right Long Finger Trigger Digit  PROCEDURE:  Procedure(s): RELEASE TRIGGER FINGER/A-1 PULLEY RIGHT LONG FINGER (Right)  SURGEON:  Surgeon(s) and Role:    * Tennis Must, MD - Primary  PHYSICIAN ASSISTANT:   ASSISTANTS: none   ANESTHESIA:   Bier block  EBL:     BLOOD ADMINISTERED:none  DRAINS: none   LOCAL MEDICATIONS USED:  MARCAINE     SPECIMEN:  Source of Specimen:  right long finger  DISPOSITION OF SPECIMEN:  PATHOLOGY  COUNTS:  YES  TOURNIQUET:   Total Tourniquet Time Documented: Upper Arm (Right) - 25 minutes Total: Upper Arm (Right) - 25 minutes   DICTATION: .Other Dictation: Dictation Number [id number]  PLAN OF CARE: Discharge to home after PACU  PATIENT DISPOSITION:  PACU - hemodynamically stable.

## 2013-04-01 NOTE — Transfer of Care (Signed)
Immediate Anesthesia Transfer of Care Note  Patient: Cynthia James  Procedure(s) Performed: Procedure(s): RELEASE TRIGGER FINGER/A-1 PULLEY RIGHT LONG FINGER (Right)  Patient Location: PACU  Anesthesia Type:Bier block  Level of Consciousness: awake  Airway & Oxygen Therapy: Patient Spontanous Breathing and Patient connected to face mask oxygen  Post-op Assessment: Report given to PACU RN and Post -op Vital signs reviewed and stable  Post vital signs: Reviewed and stable  Complications: No apparent anesthesia complications

## 2013-04-01 NOTE — Op Note (Signed)
[id number] 

## 2013-04-02 ENCOUNTER — Encounter (HOSPITAL_BASED_OUTPATIENT_CLINIC_OR_DEPARTMENT_OTHER): Payer: Self-pay | Admitting: Orthopedic Surgery

## 2013-04-02 NOTE — Op Note (Signed)
NAMENATSUKI, EVERETT NO.:  [id number]  MEDICAL RECORD NO.:  [id number]  LOCATION:                                 FACILITY:  PHYSICIAN:  Leanora Cover, MD             DATE OF BIRTH:  DATE OF PROCEDURE:  04/01/2013 DATE OF DISCHARGE:                              OPERATIVE REPORT   PREOPERATIVE DIAGNOSIS:  Right long finger trigger digit.  POSTOPERATIVE DIAGNOSIS:  Right long finger trigger digit with annular ligament cyst.  PROCEDURE:  Right long finger trigger release.  SURGEON:  Leanora Cover, MD  ASSISTANT:  None.  ANESTHESIA:  General Bier block anesthesia.  IV FLUIDS:  Per anesthesia flow sheet.  ESTIMATED BLOOD LOSS:  Minimal.  COMPLICATIONS:  None.  SPECIMENS:  Right long finger cyst to Pathology.  TOURNIQUET TIME:  25 minutes.  DISPOSITION:  Stable to PACU.  INDICATIONS:  Ms. Debara Pickett is an 79 year old right-hand dominant female who has had triggering of the right long finger for a year.  She has had this injected once with recurrence.  She wishes to have a right long finger trigger release for management of symptoms.  Risks, benefits, and alternatives of surgery were discussed including risk of blood loss, infection, damage to nerves, vessels, tendons, ligaments, bone, failure of surgery, need for additional surgery, complications with wound healing, continued pain, and decreased range of motion.  She voiced understanding of these risks and elected to proceed.  OPERATIVE COURSE:  After being identified preoperatively by myself, the patient and I agreed upon procedure and site of procedure.  Surgical site was marked.  The risks, benefits, and alternatives of surgery were reviewed and she wished to proceed.  Surgical consent had been signed. She was given IV Ancef as preoperative antibiotic prophylaxis.  She was transported to the operating room, placed on the operating room table in a supine position with the right upper extremity on arm  board.  Bier block anesthesia was induced by anesthesiologist.  The right upper extremity was prepped and draped in normal sterile orthopedic fashion. Surgical pause was performed between surgeons, anesthesia, and operating room staff, and all were in agreement as to the patient, procedure, and site of procedure.  Tourniquet at the proximal aspect of the extremity had been inflated for the Bier block.  Incision was made over the MP joint of the right long finger volarly.  This was carried into subcutaneous tissues by spreading technique.  The tissues were very adherent.  They were able to be eased off the annular ligament.  There was a cyst coming from the pulley system.  This was excised and sent to Pathology for examination.  The A1 pulley was identified and incised sharply.  Care was taken to ensure there was complete decompression. There was some small amount of an A0 pulley as well, which was released. The finger was placed through a range of motion.  The tendons were pulled through the wound and any tears between them released.  The traction on the tendon was still a little bit of a click remaining and the sheath was released a little bit more distally.  The click resolved. The wound was  copiously irrigated with sterile saline.  Bipolar electrocautery was used to obtain hemostasis.  It was closed with 4-0 nylon in a horizontal mattress fashion.  It was injected with 6 mL of 0.25% plain Marcaine to aid in postoperative analgesia.  The wound was then dressed with sterile Xeroform, 4x4s, and wrapped with a Kling and Coban dressing lightly.  Tourniquet was deflated at 25 minutes. Fingertips were pink with brisk capillary refill after deflation of tourniquet.  Operative drapes were broken down and the patient was awoken from anesthesia safely.  She was transferred back to stretcher and taken to PACU in stable condition.  I will see her back in the office in 1 week for postoperative  followup.  I will give her Norco 5/325 one to two p.o. q.6 hours p.r.n. pain, dispensed #30.     Leanora Cover, MD     KK/MEDQ  D:  04/01/2013  T:  04/02/2013  Job:  [id number]

## 2013-04-19 ENCOUNTER — Emergency Department (HOSPITAL_COMMUNITY)
Admission: EM | Admit: 2013-04-19 | Discharge: 2013-04-19 | Disposition: A | Discharge status: Home / Self Care | Payer: Medicare Other | Attending: Emergency Medicine | Admitting: Emergency Medicine

## 2013-04-19 ENCOUNTER — Emergency Department (HOSPITAL_COMMUNITY): Payer: Medicare Other

## 2013-04-19 ENCOUNTER — Encounter (HOSPITAL_COMMUNITY): Payer: Self-pay | Admitting: Emergency Medicine

## 2013-04-19 DIAGNOSIS — J40 Bronchitis, not specified as acute or chronic: Secondary | ICD-10-CM

## 2013-04-19 DIAGNOSIS — M129 Arthropathy, unspecified: Secondary | ICD-10-CM | POA: Insufficient documentation

## 2013-04-19 DIAGNOSIS — I10 Essential (primary) hypertension: Secondary | ICD-10-CM | POA: Insufficient documentation

## 2013-04-19 DIAGNOSIS — M810 Age-related osteoporosis without current pathological fracture: Secondary | ICD-10-CM | POA: Insufficient documentation

## 2013-04-19 DIAGNOSIS — Z789 Other specified health status: Secondary | ICD-10-CM | POA: Insufficient documentation

## 2013-04-19 DIAGNOSIS — Z7901 Long term (current) use of anticoagulants: Secondary | ICD-10-CM | POA: Insufficient documentation

## 2013-04-19 DIAGNOSIS — Z87448 Personal history of other diseases of urinary system: Secondary | ICD-10-CM | POA: Insufficient documentation

## 2013-04-19 DIAGNOSIS — E119 Type 2 diabetes mellitus without complications: Secondary | ICD-10-CM | POA: Insufficient documentation

## 2013-04-19 DIAGNOSIS — Z79899 Other long term (current) drug therapy: Secondary | ICD-10-CM | POA: Insufficient documentation

## 2013-04-19 DIAGNOSIS — I4891 Unspecified atrial fibrillation: Secondary | ICD-10-CM | POA: Insufficient documentation

## 2013-04-19 DIAGNOSIS — Z87891 Personal history of nicotine dependence: Secondary | ICD-10-CM | POA: Insufficient documentation

## 2013-04-19 DIAGNOSIS — E7800 Pure hypercholesterolemia, unspecified: Secondary | ICD-10-CM | POA: Insufficient documentation

## 2013-04-19 DIAGNOSIS — Z98811 Dental restoration status: Secondary | ICD-10-CM | POA: Insufficient documentation

## 2013-04-19 LAB — GLUCOSE, CAPILLARY: Glucose-Capillary: 187 mg/dL — ABNORMAL HIGH (ref 70–99)

## 2013-04-19 MED ORDER — PREDNISONE 50 MG PO TABS: 50.0000 mg | ORAL_TABLET | Freq: Every day | ORAL | Status: DC

## 2013-04-19 MED ORDER — PREDNISONE 50 MG PO TABS
60.0000 mg | ORAL_TABLET | Freq: Once | ORAL | Status: AC
  Administered 2013-04-19: 60 mg via ORAL
  Filled 2013-04-19 (×2): qty 1

## 2013-04-19 MED ORDER — ALBUTEROL SULFATE HFA 108 (90 BASE) MCG/ACT IN AERS: 1.0000 | INHALATION_SPRAY | Freq: Four times a day (QID) | RESPIRATORY_TRACT | Status: DC | PRN

## 2013-04-19 MED ORDER — ALBUTEROL SULFATE (5 MG/ML) 0.5% IN NEBU
5.0000 mg | INHALATION_SOLUTION | Freq: Once | RESPIRATORY_TRACT | Status: AC
  Administered 2013-04-19: 5 mg via RESPIRATORY_TRACT
  Filled 2013-04-19: qty 1

## 2013-04-19 NOTE — ED Notes (Signed)
Pt c/o cough x 1 week. Dry cough. pcp Monday and given meds. States now has pain with coughing only to left side. . Nad. Very mild exp wheezing throughout. Congestion noted throughout also.

## 2013-04-19 NOTE — ED Provider Notes (Signed)
CSN: [id number]     Arrival date & time 04/19/13  [id number] History  This chart was scribed for Virgel Manifold, MD by [name], ED Scribe. This patient was seen in room APA09/APA09 and the patient's care was started at 11:02 AM.    Chief Complaint  Patient presents with  . Cough    The history is provided by the patient. No language interpreter was used.   HPI Comments: Cynthia James is a 79 y.o. female who presents to the Emergency Department complaining of one week of NP cough that has been worsening since onset. She lists wheezing, left lower CP described as sharp and SOB, both with coughing only, as associated symptoms. She reports that the symptoms are usually worse at night while she is laying down to sleep. She states that she has been taking azithromycin and benzonatate prescribed by her PMD 4 days ago with no improvement. She denies any fevers or leg swelling. She denies having any h/o asthma or COPD.   Past Medical History  Diagnosis Date  . Arthritis   . Diabetes mellitus   . Hypertension   . Renal disorder   . Osteoporosis   . High cholesterol   . A-fib   . Full dentures   . Wears glasses    Past Surgical History  Procedure Laterality Date  . Tonsillectomy    . Cataract extraction      bilateral  . Abdominal hysterectomy    . Orif femur fracture  1986    lt-hip  . Orif radius & ulna fractures  1986    right  . Carpal tunnel release  1985    rt/lt  . Appendectomy    . Eye surgery    . Trigger finger release Right 04/01/2013    Procedure: RELEASE TRIGGER FINGER/A-1 PULLEY RIGHT LONG FINGER;  Surgeon: Tennis Must, MD;  Location: [place];  Service: Orthopedics;  Laterality: Right;   Family History  Problem Relation Age of Onset  . Diabetes Mother   . Heart failure Father   . Diabetes Brother   . Cancer Brother    History  Substance Use Topics  . Smoking status: Former Smoker -- 1.50 packs/day for 20 years    Types: Cigarettes     Quit date: 08/11/1994  . Smokeless tobacco: Never Used  . Alcohol Use: No   OB History   Grav Para Term Preterm Abortions TAB SAB Ect Mult Living   4 3 3  1  1   3      Review of Systems  Constitutional: Negative for fever.  Respiratory: Positive for cough, shortness of breath and wheezing.   Cardiovascular: Positive for chest pain. Negative for leg swelling.  All other systems reviewed and are negative.    Allergies  Codeine; Percocet; and Tramadol  Home Medications   Current Outpatient Rx  Name  Route  Sig  Dispense  Refill  . diltiazem (DILACOR XR) 240 MG 24 hr capsule   Oral   Take 240 mg by mouth every morning.          Marland Kitchen HYDROcodone-acetaminophen (NORCO) 5-325 MG per tablet      1-2 tabs po q6 hours prn pain   30 tablet   0   . insulin glargine (LANTUS) 100 UNIT/ML injection   Subcutaneous   Inject 24 Units into the skin at bedtime.          Marland Kitchen losartan-hydrochlorothiazide (HYZAAR) 50-12.5 MG per tablet   Oral  Take 1 tablet by mouth daily.         . pravastatin (PRAVACHOL) 20 MG tablet   Oral   Take 20 mg by mouth at bedtime.         Marland Kitchen tobramycin (TOBREX) 0.3 % ophthalmic solution   Both Eyes   Place 1 drop into both eyes daily.         Marland Kitchen warfarin (COUMADIN) 5 MG tablet   Oral   Take 5 mg by mouth daily.           Triage Vitals: BP 127/88  Pulse 90  Temp(Src) 97.7 F (36.5 C) (Oral)  Resp 21  SpO2 98%  Physical Exam  Nursing note and vitals reviewed. Constitutional: She is oriented to person, place, and time. She appears well-developed and well-nourished. No distress.  HENT:  Head: Normocephalic and atraumatic.  Eyes: EOM are normal.  Neck: Normal range of motion.  Cardiovascular: Normal rate, regular rhythm and normal heart sounds.   Pulmonary/Chest: Effort normal. She has wheezes (mild expiratory wheezing bilaterally ).  Abdominal: Soft. She exhibits no distension. There is no tenderness.  Musculoskeletal: Normal range of  motion.  Neurological: She is alert and oriented to person, place, and time.  Skin: Skin is warm and dry.  Psychiatric: She has a normal mood and affect. Judgment normal.    ED Course  Procedures (including critical care time)  DIAGNOSTIC STUDIES: Oxygen Saturation is 98% on room air, normal by my interpretation.    COORDINATION OF CARE: 11:04 AM-Discussed CXR results and that symptoms are most likely bronchitis. Discussed treatment plan which includes finishing the antibiotic, breathing treatment in the ED and prednisone for a few days with pt at bedside and pt agreed to plan. Will discharge with inhaler PRN.  Labs Review Labs Reviewed - No data to display Imaging Review Dg Chest 2 View  04/19/2013   CLINICAL DATA:  Cough for 1 week.  EXAM: CHEST  2 VIEW  COMPARISON:  Multiple priors  FINDINGS: The cardiomediastinal silhouette is unchanged. No focal infiltrate or edema. No pleural effusion or pneumothorax. No acute osseous abnormality.  IMPRESSION: No active cardiopulmonary disease.   Electronically Signed   By: Donavan Burnet M.D.   On: 04/19/2013 10:34    EKG Interpretation   None       MDM   1. Bronchitis    83yF with cough. Suspect bronchitis. Mild wheezing on exam which improved with albuterol. CXR clear. Afebrile. o2 sats good. Course steroids and prn albuterol.  I personally preformed the services scribed in my presence. The recorded information has been reviewed is accurate. Virgel Manifold, MD.    Virgel Manifold, MD 04/24/13 [phone]

## 2014-03-24 ENCOUNTER — Encounter (HOSPITAL_COMMUNITY): Payer: Self-pay | Admitting: Emergency Medicine

## 2014-12-09 ENCOUNTER — Emergency Department (HOSPITAL_COMMUNITY)
Admission: EM | Admit: 2014-12-09 | Discharge: 2014-12-10 | Disposition: A | Discharge status: Home / Self Care | Payer: Medicare HMO | Attending: Emergency Medicine | Admitting: Emergency Medicine

## 2014-12-09 ENCOUNTER — Encounter (HOSPITAL_COMMUNITY): Payer: Self-pay | Admitting: Emergency Medicine

## 2014-12-09 DIAGNOSIS — Z79899 Other long term (current) drug therapy: Secondary | ICD-10-CM | POA: Insufficient documentation

## 2014-12-09 DIAGNOSIS — H81399 Other peripheral vertigo, unspecified ear: Secondary | ICD-10-CM | POA: Diagnosis not present

## 2014-12-09 DIAGNOSIS — I4891 Unspecified atrial fibrillation: Secondary | ICD-10-CM | POA: Diagnosis not present

## 2014-12-09 DIAGNOSIS — E780 Pure hypercholesterolemia: Secondary | ICD-10-CM | POA: Insufficient documentation

## 2014-12-09 DIAGNOSIS — E119 Type 2 diabetes mellitus without complications: Secondary | ICD-10-CM | POA: Diagnosis not present

## 2014-12-09 DIAGNOSIS — M1990 Unspecified osteoarthritis, unspecified site: Secondary | ICD-10-CM | POA: Insufficient documentation

## 2014-12-09 DIAGNOSIS — Z98811 Dental restoration status: Secondary | ICD-10-CM | POA: Insufficient documentation

## 2014-12-09 DIAGNOSIS — Z973 Presence of spectacles and contact lenses: Secondary | ICD-10-CM | POA: Diagnosis not present

## 2014-12-09 DIAGNOSIS — Z87891 Personal history of nicotine dependence: Secondary | ICD-10-CM | POA: Insufficient documentation

## 2014-12-09 DIAGNOSIS — I10 Essential (primary) hypertension: Secondary | ICD-10-CM | POA: Diagnosis present

## 2014-12-09 DIAGNOSIS — N289 Disorder of kidney and ureter, unspecified: Secondary | ICD-10-CM | POA: Insufficient documentation

## 2014-12-09 DIAGNOSIS — Z7901 Long term (current) use of anticoagulants: Secondary | ICD-10-CM | POA: Diagnosis not present

## 2014-12-09 DIAGNOSIS — Z794 Long term (current) use of insulin: Secondary | ICD-10-CM | POA: Insufficient documentation

## 2014-12-09 DIAGNOSIS — Z87448 Personal history of other diseases of urinary system: Secondary | ICD-10-CM | POA: Diagnosis not present

## 2014-12-09 LAB — CBC WITH DIFFERENTIAL/PLATELET
BASOS ABS: 0 10*3/uL (ref 0.0–0.1)
BASOS PCT: 0 % (ref 0–1)
Eosinophils Absolute: 0.2 10*3/uL (ref 0.0–0.7)
Eosinophils Relative: 3 % (ref 0–5)
HCT: 39.3 % (ref 36.0–46.0)
Hemoglobin: 12.8 g/dL (ref 12.0–15.0)
Lymphocytes Relative: 30 % (ref 12–46)
Lymphs Abs: 2.3 10*3/uL (ref 0.7–4.0)
MCH: 30 pg (ref 26.0–34.0)
MCHC: 32.6 g/dL (ref 30.0–36.0)
MCV: 92.3 fL (ref 78.0–100.0)
MONO ABS: 0.5 10*3/uL (ref 0.1–1.0)
Monocytes Relative: 7 % (ref 3–12)
NEUTROS PCT: 60 % (ref 43–77)
Neutro Abs: 4.6 10*3/uL (ref 1.7–7.7)
PLATELETS: 251 10*3/uL (ref 150–400)
RBC: 4.26 MIL/uL (ref 3.87–5.11)
RDW: 13.7 % (ref 11.5–15.5)
WBC: 7.7 10*3/uL (ref 4.0–10.5)

## 2014-12-09 LAB — BASIC METABOLIC PANEL
ANION GAP: 8 (ref 5–15)
BUN: 31 mg/dL — ABNORMAL HIGH (ref 6–20)
CALCIUM: 8.5 mg/dL — AB (ref 8.9–10.3)
CO2: 27 mmol/L (ref 22–32)
Chloride: 103 mmol/L (ref 101–111)
Creatinine, Ser: 2 mg/dL — ABNORMAL HIGH (ref 0.44–1.00)
GFR calc non Af Amer: 22 mL/min — ABNORMAL LOW (ref >60)
GFR, EST AFRICAN AMERICAN: 26 mL/min — AB (ref >60)
Glucose, Bld: 239 mg/dL — ABNORMAL HIGH (ref 65–99)
Potassium: 4 mmol/L (ref 3.5–5.1)
Sodium: 138 mmol/L (ref 135–145)

## 2014-12-09 MED ORDER — SODIUM CHLORIDE 0.9 % IV BOLUS (SEPSIS)
500.0000 mL | Freq: Once | INTRAVENOUS | Status: AC
  Administered 2014-12-09: 500 mL via INTRAVENOUS

## 2014-12-09 MED ORDER — MECLIZINE HCL 12.5 MG PO TABS
25.0000 mg | ORAL_TABLET | Freq: Once | ORAL | Status: AC
  Administered 2014-12-09: 25 mg via ORAL
  Filled 2014-12-09: qty 2

## 2014-12-09 NOTE — ED Provider Notes (Signed)
CSN: [id number]     Arrival date & time 12/09/14  2151 History  This chart was scribed for Cynthia Fuel, MD by Helane Gunther, ED Scribe. This patient was seen in room APA19/APA19 and the patient's care was started at 11:04 PM.    Chief Complaint  Patient presents with  . Hypertension   The history is provided by the patient. No language interpreter was used.   HPI Comments: Cynthia James is a 81 y.o. female with PMHx of HTN and DM who presents to the Emergency Department complaining of HTN. She states measuring her BP at 195/107 just PTA. Pt reports worsening associated dizziness described as though she was "going to pass out." She notes feeling off-balance when she first stands up. She states that this does not feel like her DM low sugar episodes. She goes to see her PCP in [place], [place] every 3 months. Pt denies CP, nausea, cough, and trouble urinating.  Past Medical History  Diagnosis Date  . Arthritis   . Diabetes mellitus   . Hypertension   . Renal disorder   . Osteoporosis   . High cholesterol   . A-fib   . Full dentures   . Wears glasses    Past Surgical History  Procedure Laterality Date  . Tonsillectomy    . Cataract extraction      bilateral  . Abdominal hysterectomy    . Orif femur fracture  1986    lt-hip  . Orif radius & ulna fractures  1986    right  . Carpal tunnel release  1985    rt/lt  . Appendectomy    . Eye surgery    . Trigger finger release Right 04/01/2013    Procedure: RELEASE TRIGGER FINGER/A-1 PULLEY RIGHT LONG FINGER;  Surgeon: Tennis Must, MD;  Location: [facility];  Service: Orthopedics;  Laterality: Right;   Family History  Problem Relation Age of Onset  . Diabetes Mother   . Heart failure Father   . Diabetes Brother   . Cancer Brother    History  Substance Use Topics  . Smoking status: Former Smoker -- 1.50 packs/day for 20 years    Types: Cigarettes    Quit date: 08/11/1994  . Smokeless tobacco: Never Used  .  Alcohol Use: No   OB History    Gravida Para Term Preterm AB TAB SAB Ectopic Multiple Living   4 3 3  1  1   3      Review of Systems  Respiratory: Negative for cough.   Gastrointestinal: Negative for nausea.  Genitourinary: Negative for difficulty urinating.  Neurological: Positive for dizziness.  All other systems reviewed and are negative.   Allergies  Codeine; Percocet; Tramadol; and Vicodin  Home Medications   Prior to Admission medications   Medication Sig Start Date End Date Taking? Authorizing Provider  albuterol (PROAIR HFA) 108 (90 BASE) MCG/ACT inhaler Inhale 1-2 puffs into the lungs every 6 (six) hours as needed for wheezing or shortness of breath. 04/19/13   Virgel Manifold, MD  diltiazem (DILACOR XR) 240 MG 24 hr capsule Take 240 mg by mouth every morning.     Historical Provider, MD  HYDROcodone-acetaminophen [facility]) 5-325 MG per tablet 1-2 tabs po q6 hours prn pain 04/01/13   Leanora Cover, MD  insulin glargine (LANTUS) 100 UNIT/ML injection Inject 24 Units into the skin at bedtime.     Historical Provider, MD  losartan-hydrochlorothiazide (HYZAAR) 50-12.5 MG per tablet Take 1 tablet by mouth  daily.    Historical Provider, MD  pravastatin (PRAVACHOL) 20 MG tablet Take 20 mg by mouth at bedtime.    Historical Provider, MD  predniSONE (DELTASONE) 50 MG tablet Take 1 tablet (50 mg total) by mouth daily. 04/19/13   Virgel Manifold, MD  tobramycin (TOBREX) 0.3 % ophthalmic solution Place 1 drop into both eyes daily.    Historical Provider, MD  warfarin (COUMADIN) 5 MG tablet Take 5 mg by mouth daily.     Historical Provider, MD   BP 150/62 mmHg  Pulse 84  Temp(Src) 98.9 F (37.2 C) (Oral)  Resp 20  Ht 5' (1.524 m)  Wt 209 lb (94.802 kg)  BMI 40.82 kg/m2  SpO2 99% Physical Exam  Constitutional: She is oriented to person, place, and time. She appears well-developed and well-nourished. No distress.  HENT:  Head: Normocephalic and atraumatic.  Mouth/Throat: Oropharynx is  clear and moist.  Eyes: EOM are normal. Pupils are equal, round, and reactive to light.  Neck: Normal range of motion. Neck supple. No JVD present.  No carotid bruits  Cardiovascular: Normal rate, regular rhythm and normal heart sounds.   No murmur heard. Irregularly irregular rhythm  Pulmonary/Chest: Breath sounds normal. She has no wheezes. She has no rales. She exhibits no tenderness.  Abdominal: Soft. Bowel sounds are normal. She exhibits no mass. There is no tenderness. There is no rebound and no guarding.  Musculoskeletal: Normal range of motion. She exhibits no edema.  Lymphadenopathy:    She has no cervical adenopathy.  Neurological: She is alert and oriented to person, place, and time. No cranial nerve deficit. She exhibits normal muscle tone.  Moderate horizontal nystagmus.  Symptoms not reproduced by passive head movement, normal finger to nose test,  Romberg Test: general unsteadiness but does not consistently fall in any one direction,  Skin: Skin is warm and dry. No rash noted.  Psychiatric: She has a normal mood and affect. Her behavior is normal. Judgment and thought content normal.  Nursing note and vitals reviewed.   ED Course  Procedures  DIAGNOSTIC STUDIES: Oxygen Saturation is 99% on RA, normal by my interpretation.    COORDINATION OF CARE: 11:18 PM - Discussed comparatively unchanged EKG. Discussed worsened kidney function. Discussed plans to order urinalysis. May keep overnight if symptoms continue/worsen and order MRI in the morning for possible stroke. Advised pt to relax for a 10-53min before taking BP in future. Pt advised of plan for treatment and pt agrees.  12:21 AM - Pt reports moderate improvement with meclozine. Will attempt to ambulate her to see how she does. If all is well, will discharge.   Labs Review Results for orders placed or performed during the hospital encounter of 12/09/14  CBC with Differential  Result Value Ref Range   WBC 7.7 4.0 -  10.5 K/uL   RBC 4.26 3.87 - 5.11 MIL/uL   Hemoglobin 12.8 12.0 - 15.0 g/dL   HCT 39.3 36.0 - 46.0 %   MCV 92.3 78.0 - 100.0 fL   MCH 30.0 26.0 - 34.0 pg   MCHC 32.6 30.0 - 36.0 g/dL   RDW 13.7 11.5 - 15.5 %   Platelets 251 150 - 400 K/uL   Neutrophils Relative % 60 43 - 77 %   Neutro Abs 4.6 1.7 - 7.7 K/uL   Lymphocytes Relative 30 12 - 46 %   Lymphs Abs 2.3 0.7 - 4.0 K/uL   Monocytes Relative 7 3 - 12 %   Monocytes Absolute 0.5 0.1 -  1.0 K/uL   Eosinophils Relative 3 0 - 5 %   Eosinophils Absolute 0.2 0.0 - 0.7 K/uL   Basophils Relative 0 0 - 1 %   Basophils Absolute 0.0 0.0 - 0.1 K/uL  Basic metabolic panel  Result Value Ref Range   Sodium 138 135 - 145 mmol/L   Potassium 4.0 3.5 - 5.1 mmol/L   Chloride 103 101 - 111 mmol/L   CO2 27 22 - 32 mmol/L   Glucose, Bld 239 (H) 65 - 99 mg/dL   BUN 31 (H) 6 - 20 mg/dL   Creatinine, Ser 2.00 (H) 0.44 - 1.00 mg/dL   Calcium 8.5 (L) 8.9 - 10.3 mg/dL   GFR calc non Af Amer 22 (L) >60 mL/min   GFR calc Af Amer 26 (L) >60 mL/min   Anion gap 8 5 - 15  Urinalysis, Routine w reflex microscopic (not at [facility])  Result Value Ref Range   Color, Urine YELLOW YELLOW   APPearance HAZY (A) CLEAR   Specific Gravity, Urine 1.010 1.005 - 1.030   pH 7.5 5.0 - 8.0   Glucose, UA 100 (A) NEGATIVE mg/dL   Hgb urine dipstick NEGATIVE NEGATIVE   Bilirubin Urine NEGATIVE NEGATIVE   Ketones, ur NEGATIVE NEGATIVE mg/dL   Protein, ur NEGATIVE NEGATIVE mg/dL   Urobilinogen, UA 0.2 0.0 - 1.0 mg/dL   Nitrite NEGATIVE NEGATIVE   Leukocytes, UA LARGE (A) NEGATIVE  Urine microscopic-add on  Result Value Ref Range   Squamous Epithelial / LPF MANY (A) RARE   WBC, UA 7-10 <3 WBC/hpf   RBC / HPF 0-2 <3 RBC/hpf   Bacteria, UA MANY (A) RARE    EKG Interpretation   Date/Time:  Tuesday December 09 2014 22:15:11 EDT Ventricular Rate:  82 PR Interval:    QRS Duration: 80 QT Interval:  381 QTC Calculation: 445 R Axis:   56 Text Interpretation:  Atrial  fibrillation Low voltage, precordial leads  Abnormal R-wave progression, early transition When compared with ECG of  08/15/2012, No significant change was found Confirmed by [facility]  MD, Ilo Beamon  ([id number]) on 12/09/2014 11:14:20 PM      MDM   Final diagnoses:  Peripheral vertigo, unspecified laterality  Renal insufficiency    Dizziness which has characteristics of both central and peripheral vertigo. Initial laboratory evaluation does show a creatinine of 2.0 which is increased from 1.555, but previous value was 21 months ago. A shunt is not sure what her most recent creatinine was. Urinalysis is checked to look for sign of infection is unremarkable. She was given a dose of meclizine with excellent relief of symptoms and she was able to ambulate normally following this. Therefore, she will be treated as peripheral vertigo and is discharged with prescription for meclizine. She is instructed to follow-up with her PCP regarding response to meclizine and follow-up regarding her renal insufficiency.  I personally performed the services described in this documentation, which was scribed in my presence. The recorded information has been reviewed and is accurate.      Cynthia Fuel, MD [id number]

## 2014-12-09 NOTE — ED Notes (Signed)
Patient reports she feels shaky and feels as if she is going to pass out when she stands up. Also reports her blood pressure has been elevated today.

## 2014-12-10 LAB — URINE MICROSCOPIC-ADD ON

## 2014-12-10 LAB — URINALYSIS, ROUTINE W REFLEX MICROSCOPIC
Bilirubin Urine: NEGATIVE
GLUCOSE, UA: 100 mg/dL — AB
Hgb urine dipstick: NEGATIVE
Ketones, ur: NEGATIVE mg/dL
NITRITE: NEGATIVE
PROTEIN: NEGATIVE mg/dL
Specific Gravity, Urine: 1.01 (ref 1.005–1.030)
Urobilinogen, UA: 0.2 mg/dL (ref 0.0–1.0)
pH: 7.5 (ref 5.0–8.0)

## 2014-12-10 MED ORDER — MECLIZINE HCL 25 MG PO TABS: 25.0000 mg | ORAL_TABLET | Freq: Three times a day (TID) | ORAL | Status: DC | PRN

## 2014-12-10 NOTE — ED Notes (Signed)
Ambulated Pt down the hall. She denies short of breath and dizziness. Pt sated "I feel pretty good."

## 2014-12-10 NOTE — Discharge Instructions (Signed)
Your creatinine (blood test for your kidney) is a little higher than it was the last time it was checked in the [place] (2.0 today, 1.55 on 03/29/2013). Please follow up with your doctor to see if this is actually a recent change, or where your kidney test has been.  Vertigo Vertigo means you feel like you or your surroundings are moving when they are not. Vertigo can be dangerous if it occurs when you are at work, driving, or performing difficult activities.  CAUSES  Vertigo occurs when there is a conflict of signals sent to your brain from the visual and sensory systems in your body. There are many different causes of vertigo, including:  Infections, especially in the inner ear.  A bad reaction to a drug or misuse of alcohol and medicines.  Withdrawal from drugs or alcohol.  Rapidly changing positions, such as lying down or rolling over in bed.  A migraine headache.  Decreased blood flow to the brain.  Increased pressure in the brain from a head injury, infection, tumor, or bleeding. SYMPTOMS  You may feel as though the world is spinning around or you are falling to the ground. Because your balance is upset, vertigo can cause nausea and vomiting. You may have involuntary eye movements (nystagmus). DIAGNOSIS  Vertigo is usually diagnosed by physical exam. If the cause of your vertigo is unknown, your caregiver may perform imaging tests, such as an MRI scan (magnetic resonance imaging). TREATMENT  Most cases of vertigo resolve on their own, without treatment. Depending on the cause, your caregiver may prescribe certain medicines. If your vertigo is related to body position issues, your caregiver may recommend movements or procedures to correct the problem. In rare cases, if your vertigo is caused by certain inner ear problems, you may need surgery. HOME CARE INSTRUCTIONS   Follow your caregiver's instructions.  Avoid driving.  Avoid operating heavy machinery.  Avoid  performing any tasks that would be dangerous to you or others during a vertigo episode.  Tell your caregiver if you notice that certain medicines seem to be causing your vertigo. Some of the medicines used to treat vertigo episodes can actually make them worse in some people. SEEK IMMEDIATE MEDICAL CARE IF:   Your medicines do not relieve your vertigo or are making it worse.  You develop problems with talking, walking, weakness, or using your arms, hands, or legs.  You develop severe headaches.  Your nausea or vomiting continues or gets worse.  You develop visual changes.  A family member notices behavioral changes.  Your condition gets worse. MAKE SURE YOU:  Understand these instructions.  Will watch your condition.  Will get help right away if you are not doing well or get worse. Document Released: 02/16/2005 Document Revised: 08/01/2011 Document Reviewed: 11/25/2010 [facility] Patient Information 2015 [place], [place]. This information is not intended to replace advice given to you by your health care provider. Make sure you discuss any questions you have with your health care provider.  Meclizine tablets or capsules What is this medicine? MECLIZINE (MEK li zeen) is an antihistamine. It is used to prevent nausea, vomiting, or dizziness caused by motion sickness. It is also used to prevent and treat vertigo (extreme dizziness or a feeling that you or your surroundings are tilting or spinning around). This medicine may be used for other purposes; ask your health care provider or pharmacist if you have questions. COMMON BRAND NAME(S): Antivert, Dramamine Less Drowsy, Medivert, Meni-D What should I tell my  health care provider before I take this medicine? They need to know if you have any of these conditions: -asthma -glaucoma -prostate trouble -stomach problems -urinary problems -an unusual or allergic reaction to meclizine, other medicines, foods, dyes, or  preservatives -pregnant or trying to get pregnant -breast-feeding How should I use this medicine? Take this medicine by mouth with a glass of water. Follow the directions on the prescription label. If you are using this medicine to prevent motion sickness, take the dose at least 1 hour before travel. If it upsets your stomach, take it with food or milk. Take your doses at regular intervals. Do not take your medicine more often than directed. Talk to your pediatrician regarding the use of this medicine in children. Special care may be needed. Overdosage: If you think you have taken too much of this medicine contact a poison control center or emergency room at once. NOTE: This medicine is only for you. Do not share this medicine with others. What if I miss a dose? If you miss a dose, take it as soon as you can. If it is almost time for your next dose, take only that dose. Do not take double or extra doses. What may interact with this medicine? -barbiturate medicines for inducing sleep or treating seizures -digoxin -medicines for anxiety or sleeping problems, like alprazolam, diazepam or temazepam -medicines for hay fever and other allergies -medicines for mental depression -medicines for movement abnormalities as in Parkinson's disease, or for stomach problems -medicines for pain -medicines that relax muscles This list may not describe all possible interactions. Give your health care provider a list of all the medicines, herbs, non-prescription drugs, or dietary supplements you use. Also tell them if you smoke, drink alcohol, or use illegal drugs. Some items may interact with your medicine. What should I watch for while using this medicine? If you are taking this medicine on a regular schedule, visit your doctor or health care professional for regular checks on your progress. You may get dizzy, drowsy or have blurred vision. Do not drive, use machinery, or do anything that needs mental alertness  until you know how this medicine affects you. Do not stand or sit up quickly, especially if you are an older patient. This reduces the risk of dizzy or fainting spells. Alcohol can increase possible dizziness. Avoid alcoholic drinks. Your mouth may get dry. Chewing sugarless gum or sucking hard candy, and drinking plenty of water may help. Contact your doctor if the problem does not go away or is severe. This medicine may cause dry eyes and blurred vision. If you wear contact lenses you may feel some discomfort. Lubricating drops may help. See your eye doctor if the problem does not go away or is severe. What side effects may I notice from receiving this medicine? Side effects that you should report to your doctor or health care professional as soon as possible: -fainting spells -fast or irregular heartbeat Side effects that usually do not require medical attention (report to your doctor or health care professional if they continue or are bothersome): -constipation -difficulty passing urine -difficulty sleeping -headache -stomach upset This list may not describe all possible side effects. Call your doctor for medical advice about side effects. You may report side effects to FDA at 1-800-FDA-1088. Where should I keep my medicine? Keep out of the reach of children. Store at room temperature between 15 and 30 degrees C (59 and 86 degrees F). Keep container tightly closed. Throw away any unused medicine  after the expiration date. NOTE: This sheet is a summary. It may not cover all possible information. If you have questions about this medicine, talk to your doctor, pharmacist, or health care provider.  2015, Elsevier/Gold Standard. (2007-11-15 10:35:36)

## 2014-12-10 NOTE — ED Notes (Signed)
Ambulate Pt to the bathroom. She denies any dizziness and with no assistance.

## 2015-06-30 ENCOUNTER — Encounter (HOSPITAL_COMMUNITY): Payer: Self-pay

## 2015-06-30 ENCOUNTER — Emergency Department (HOSPITAL_COMMUNITY): Payer: Medicare HMO

## 2015-06-30 ENCOUNTER — Emergency Department (HOSPITAL_COMMUNITY)
Admission: EM | Admit: 2015-06-30 | Discharge: 2015-06-30 | Disposition: A | Discharge status: Home / Self Care | Payer: Medicare HMO | Attending: Emergency Medicine | Admitting: Emergency Medicine

## 2015-06-30 DIAGNOSIS — R079 Chest pain, unspecified: Secondary | ICD-10-CM | POA: Diagnosis not present

## 2015-06-30 DIAGNOSIS — M545 Low back pain: Secondary | ICD-10-CM | POA: Insufficient documentation

## 2015-06-30 DIAGNOSIS — E119 Type 2 diabetes mellitus without complications: Secondary | ICD-10-CM | POA: Diagnosis not present

## 2015-06-30 DIAGNOSIS — I10 Essential (primary) hypertension: Secondary | ICD-10-CM | POA: Diagnosis not present

## 2015-06-30 DIAGNOSIS — M546 Pain in thoracic spine: Secondary | ICD-10-CM | POA: Diagnosis not present

## 2015-06-30 NOTE — ED Notes (Signed)
Pt not in waiting room

## 2015-06-30 NOTE — ED Notes (Signed)
Called for patient x 2, pt not in waiting room.

## 2015-06-30 NOTE — ED Notes (Signed)
Called for pt x 3 but pt not in waiting room.  Confirmed with radiology she was not in their dept.

## 2015-06-30 NOTE — ED Notes (Signed)
Pt c/o mid to lower back pain for the past few days.  Reports chest pain started today.  Reports pain is nonradiating, no sob or n/v.

## 2016-07-25 ENCOUNTER — Ambulatory Visit: Payer: Medicare HMO | Admitting: Podiatry

## 2017-01-27 ENCOUNTER — Other Ambulatory Visit (HOSPITAL_BASED_OUTPATIENT_CLINIC_OR_DEPARTMENT_OTHER): Payer: Self-pay

## 2017-01-27 DIAGNOSIS — I482 Chronic atrial fibrillation, unspecified: Secondary | ICD-10-CM

## 2017-04-26 ENCOUNTER — Other Ambulatory Visit (HOSPITAL_BASED_OUTPATIENT_CLINIC_OR_DEPARTMENT_OTHER): Payer: Self-pay

## 2017-04-26 DIAGNOSIS — I4891 Unspecified atrial fibrillation: Secondary | ICD-10-CM

## 2017-04-28 ENCOUNTER — Ambulatory Visit: Payer: Medicare HMO | Attending: Internal Medicine | Admitting: Neurology

## 2017-04-28 DIAGNOSIS — I4891 Unspecified atrial fibrillation: Secondary | ICD-10-CM | POA: Insufficient documentation

## 2017-04-28 DIAGNOSIS — G4733 Obstructive sleep apnea (adult) (pediatric): Secondary | ICD-10-CM

## 2017-05-01 NOTE — Procedures (Signed)
[facility] Kofi A. Doonquah, MD     [email or web address]             NOCTURNAL POLYSOMNOGRAPHY   LOCATION: [facility]   Patient Name: Cynthia James, Cynthia James Study Date: 04/28/2017 Gender: Female D.O.B: 10/05/1929 Age (years): 87 Referring Provider: Denise Hunter MD Height (inches): 59 Interpreting Physician: Kofi Doonquah MD, ABSM Weight (lbs): 212 RPSGT: Peak, Robert BMI: 43 MRN: [id number] Neck Size: 16.00 CLINICAL INFORMATION Sleep Study Type: Split Night CPAP  Indication for sleep study: N/A  Epworth Sleepiness Score:  SLEEP STUDY TECHNIQUE As per the AASM Manual for the Scoring of Sleep and Associated Events v2.3 (April 2016) with a hypopnea requiring 4% desaturations.  The channels recorded and monitored were frontal, central and occipital EEG, electrooculogram (EOG), submentalis EMG (chin), nasal and oral airflow, thoracic and abdominal wall motion, anterior tibialis EMG, snore microphone, electrocardiogram, and pulse oximetry. Continuous positive airway pressure (CPAP) was initiated when the patient met split night criteria and was titrated according to treat sleep-disordered breathing.  MEDICATIONS Medications self-administered by patient taken the night of the study : N/A   Current Outpatient Medications:  .  albuterol (PROAIR HFA) 108 (90 BASE) MCG/ACT inhaler, Inhale 1-2 puffs into the lungs every 6 (six) hours as needed for wheezing or shortness of breath., Disp: 1 Inhaler, Rfl: 0 .  diltiazem (DILACOR XR) 240 MG 24 hr capsule, Take 240 mg by mouth every morning. , Disp: , Rfl:  .  HYDROcodone-acetaminophen (NORCO) 5-325 MG per tablet, 1-2 tabs po q6 hours prn pain, Disp: 30 tablet, Rfl: 0 .  insulin glargine (LANTUS) 100 UNIT/ML injection, Inject 24 Units into the skin at bedtime. , Disp: , Rfl:  .  losartan-hydrochlorothiazide (HYZAAR) 50-12.5 MG per tablet, Take 1 tablet by mouth daily., Disp: , Rfl:  .  meclizine (ANTIVERT) 25 MG tablet, Take 1  tablet (25 mg total) by mouth 3 (three) times daily as needed for dizziness., Disp: 30 tablet, Rfl: 0 .  pravastatin (PRAVACHOL) 20 MG tablet, Take 20 mg by mouth at bedtime., Disp: , Rfl:  .  predniSONE (DELTASONE) 50 MG tablet, Take 1 tablet (50 mg total) by mouth daily., Disp: 3 tablet, Rfl: 0 .  tobramycin (TOBREX) 0.3 % ophthalmic solution, Place 1 drop into both eyes daily., Disp: , Rfl:  .  warfarin (COUMADIN) 5 MG tablet, Take 5 mg by mouth daily. , Disp: , Rfl:    RESPIRATORY PARAMETERS Diagnostic  Total AHI (/hr): 86.2 RDI (/hr): 88.4 OA Index (/hr): 10.5 CA Index (/hr): 0.4 REM AHI (/hr): 112.0 NREM AHI (/hr): 83.0 Supine AHI (/hr): 86.2 Non-supine AHI (/hr): N/A Min O2 Sat (%): 69.00 Mean O2 (%): 89.56 Time below 88% (min): 32.6   Titration:  On successful titration was attempted. CPAP was attempted between pressures of 5 and 13. BiPAP was also attempted at pressures of 8/4 to 8/6.    Optimal Pressure (cm): 13 AHI at Optimal Pressure (/hr): 70.5 Min O2 at Optimal Pressure (%): 81.0 Supine % at Optimal (%): 100 Sleep % at Optimal (%): 100   SLEEP ARCHITECTURE The recording time for the entire night was 396.5 minutes.  During a baseline period of 159.6 minutes, the patient slept for 136.5 minutes in REM and nonREM, yielding a sleep efficiency of 85.5%. Sleep onset after lights out was 10.1 minutes with a REM latency of 86.5 minutes. The patient spent 13.19% of the night in stage N1 sleep, 67.40% in stage N2 sleep, 8.42% in stage N3 and 10.99% in REM.    During the titration period of 234.7 minutes, the patient slept for 215.5 minutes in REM and nonREM, yielding a sleep efficiency of 91.8%. Sleep onset after CPAP initiation was 1.7 minutes with a REM latency of 73.5 minutes. The patient spent 6.73% of the night in stage N1 sleep, 52.68% in stage N2 sleep, 9.05% in stage N3 and 31.54% in REM.  CARDIAC DATA The 2 lead EKG demonstrated atrial fibrillation. The mean heart rate was 65.00  beats per minute. Other EKG findings include: None. LEG MOVEMENT DATA The total Periodic Limb Movements of Sleep (PLMS) were 0. The PLMS index was 0.00.  IMPRESSIONS - Severe obstructive sleep apnea worse during REM sleep occurred during the diagnostic portion of the study (AHI = 86.2/hour).  Conventional CPAP/bilevel pressures were unsuccessful.  A full night formal positive titration study is recommended.   - Atrial fibrillation is documented during this recording.   Delano Metz, MD Diplomate, American Board of Sleep Medicine.'ELECTRONICALLY SIGNED ON:  05/01/2017, 10:31 AM [place] PH: [phone]   FX: [phone] [place]

## 2017-07-05 ENCOUNTER — Ambulatory Visit (HOSPITAL_COMMUNITY)
Admission: RE | Admit: 2017-07-05 | Discharge: 2017-07-05 | Disposition: A | Discharge status: Home / Self Care | Payer: Medicare HMO | Source: Ambulatory Visit | Attending: Family Medicine | Admitting: Family Medicine

## 2017-07-05 ENCOUNTER — Other Ambulatory Visit (HOSPITAL_COMMUNITY): Payer: Self-pay | Admitting: Family Medicine

## 2017-07-05 DIAGNOSIS — Z7901 Long term (current) use of anticoagulants: Secondary | ICD-10-CM

## 2017-07-05 DIAGNOSIS — G319 Degenerative disease of nervous system, unspecified: Secondary | ICD-10-CM | POA: Insufficient documentation

## 2017-07-05 DIAGNOSIS — W010XXA Fall on same level from slipping, tripping and stumbling without subsequent striking against object, initial encounter: Secondary | ICD-10-CM | POA: Diagnosis not present

## 2017-07-05 DIAGNOSIS — S0990XA Unspecified injury of head, initial encounter: Secondary | ICD-10-CM | POA: Insufficient documentation

## 2017-08-15 ENCOUNTER — Other Ambulatory Visit (HOSPITAL_BASED_OUTPATIENT_CLINIC_OR_DEPARTMENT_OTHER): Payer: Self-pay

## 2017-08-15 DIAGNOSIS — G4733 Obstructive sleep apnea (adult) (pediatric): Secondary | ICD-10-CM

## 2017-08-15 DIAGNOSIS — G4731 Primary central sleep apnea: Secondary | ICD-10-CM

## 2017-08-16 ENCOUNTER — Other Ambulatory Visit (HOSPITAL_BASED_OUTPATIENT_CLINIC_OR_DEPARTMENT_OTHER): Payer: Self-pay

## 2017-08-16 DIAGNOSIS — G4731 Primary central sleep apnea: Secondary | ICD-10-CM

## 2017-08-16 DIAGNOSIS — G4733 Obstructive sleep apnea (adult) (pediatric): Secondary | ICD-10-CM

## 2017-08-18 ENCOUNTER — Ambulatory Visit: Payer: Medicare HMO | Attending: Neurology | Admitting: Neurology

## 2017-08-18 DIAGNOSIS — G4731 Primary central sleep apnea: Secondary | ICD-10-CM | POA: Diagnosis present

## 2017-08-18 DIAGNOSIS — G4733 Obstructive sleep apnea (adult) (pediatric): Secondary | ICD-10-CM | POA: Diagnosis not present

## 2017-08-26 NOTE — Procedures (Signed)
Benicia A. Merlene Laughter, MD     [email or web address]             NOCTURNAL POLYSOMNOGRAPHY   LOCATION: [facility]     Delano Metz, MD Diplomate, American Board of Sleep Medicine.  ELECTRONICALLY SIGNED ON:  08/26/2017, 12:09 PM [place]: [phone]   [place]: [phone] [place] Patient Name: Cynthia James, Cynthia James Date: 08/18/2017 Gender: Female D.O.B: [birth year] Age (years): 43 Referring Provider: Phillips Odor MD, ABSM Height (inches): 59 Interpreting Physician: Phillips Odor MD, ABSM Weight (lbs): 212 RPSGT: Peak, Robert BMI: 43 MRN: [id number] Neck Size: 16.00 <br> <br> CLINICAL INFORMATION The patient is referred for a adaptive servo-ventilator titration study.  Previous studies that was treated severe central sleep apnea syndrome and moderate obstructive sleep apnea syndrome. Most recent polysomnogram dated 04/28/2017 revealed an AHI of 86.2/h and RDI of 88.4/h. Most recent titration study dated 04/28/2017 was optimal at 13cm H2O with an AHI of 70.5/h.    MEDICATIONS Medications self-administered by patient taken the night of the study : N/A  Current Outpatient Medications:  .  albuterol (PROAIR HFA) 108 (90 BASE) MCG/ACT inhaler, Inhale 1-2 puffs into the lungs every 6 (six) hours as needed for wheezing or shortness of breath., Disp: 1 Inhaler, Rfl: 0 .  diltiazem (DILACOR XR) 240 MG 24 hr capsule, Take 240 mg by mouth every morning. , Disp: , Rfl:  .  HYDROcodone-acetaminophen (NORCO) 5-325 MG per tablet, 1-2 tabs po q6 hours prn pain, Disp: 30 tablet, Rfl: 0 .  insulin glargine (LANTUS) 100 UNIT/ML injection, Inject 24 Units into the skin at bedtime. , Disp: , Rfl:  .  losartan-hydrochlorothiazide (HYZAAR) 50-12.5 MG per tablet, Take 1 tablet by mouth daily., Disp: , Rfl:  .  meclizine (ANTIVERT) 25 MG tablet, Take 1 tablet (25 mg total) by mouth 3 (three) times  daily as needed for dizziness., Disp: 30 tablet, Rfl: 0 .  pravastatin (PRAVACHOL) 20 MG tablet, Take 20 mg by mouth at bedtime., Disp: , Rfl:  .  predniSONE (DELTASONE) 50 MG tablet, Take 1 tablet (50 mg total) by mouth daily., Disp: 3 tablet, Rfl: 0 .  tobramycin (TOBREX) 0.3 % ophthalmic solution, Place 1 drop into both eyes daily., Disp: , Rfl:  .  warfarin (COUMADIN) 5 MG tablet, Take 5 mg by mouth daily. , Disp: , Rfl:    SLEEP STUDY TECHNIQUE As per the AASM Manual for the Scoring of Sleep and Associated Events v2.3 (April 2016) with a hypopnea requiring 4% desaturations.  The channels recorded and monitored were frontal, central and occipital EEG, electrooculogram (EOG), submentalis EMG (chin), nasal and oral airflow, thoracic and abdominal wall motion, anterior tibialis EMG, snore microphone, electrocardiogram, and pulse oximetry.  RESPIRATORY PARAMETERS Optimal Min IPAP (cm): 9 Optimal Max IPAP (cm): 15 Optimal Min EPAP (cm): 9 Optimal Max EPAP (cm): 15 Optimal Max Pressure (cm): 10 Optimal Min PS (cm): 0 Optimal Max PS (cm): 10 Opitmal Breathing Rate (/min): Auto Overall Min O2 (%): 71.0 Min O2 at Optimal Pressure (%): 71.0 AHI at Optimal (/hr): N/A   SLEEP ARCHITECTURE During a recording time of 364.9 minutes, the patient slept for 245.5 minutes. Sleep efficiency was 67.3%%. The patient spent 7.7%% of the night in stage N1 sleep, 59.5%% in stage N2 sleep, 6.5%% in stage N3 and 26.27% in REM. Wake after sleep onset (WASO) was 111.7 minutes. Alpha intrusion was PRESENT ABSENT. Supine sleep was 100.00%.  The arousal index was 9.8.  LEG MOVEMENT DATA PLM Index (/hr): 0.0 PLM Arousal Index (/hr): 0.0 CARDIAC DATA The 2 lead EKG demonstrated atrial fibrillation. The mean heart rate was 69.5 beats per minute. Other EKG findings include: None.    IMPRESSIONS 1. Successful adaptive servo-ventilation is a chain with the following settings: ???????Trial of SV Advance EPAP Min 9 and Max  15 cmH2O, Pressure Support Min 0 and Max 10 cmH2O with Max Pressure of 25 cmH2O and Breath Rate of Auto BrPM.   RECOMMENDATIONS 1. Trial of SV Advance EPAP Min 9 and Max 15 cmH2O, Pressure Support Min 0 and Max 10 cmH2O with Max Pressure of 25 cmH2O and Breath Rate of Auto BrPM.

## 2018-10-07 IMAGING — CT CT HEAD W/O CM
3 series · 16 of 47 positions shown, 19 images · non-contrast
Comparison: None

CLINICAL DATA: Fall.  Hit head on floor.

EXAM:
CT HEAD WITHOUT CONTRAST
TECHNIQUE: Contiguous axial images were obtained from the base of the skull
through the vertex without intravenous contrast.

[Series 2: head trauma wo · axial · 0.41mm/px · z∈[-37,+93]mm · 10 of 32 slices shown, 13 images]
[im 3/32  brain]
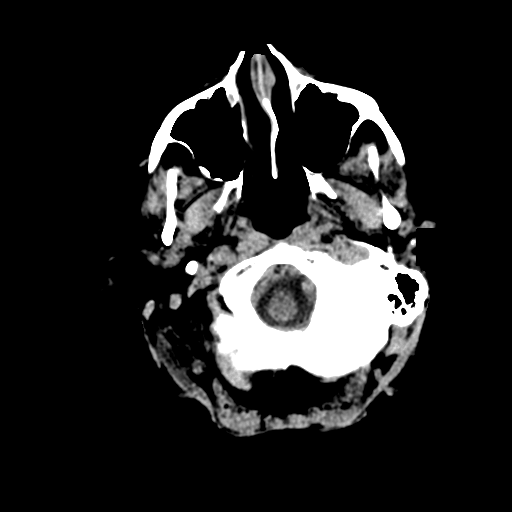
[im 3/32  bone]
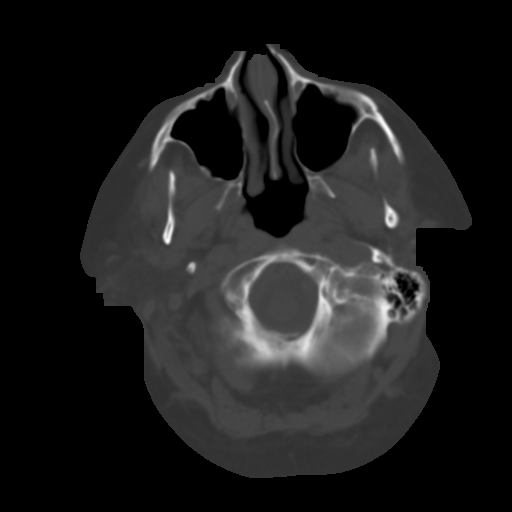
[im 6/32  brain]
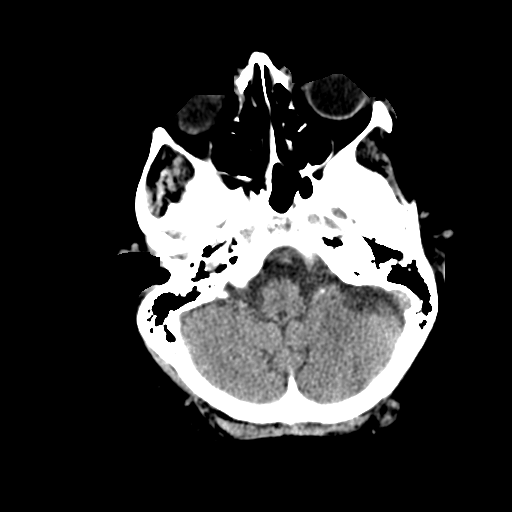
[im 9/32  brain]
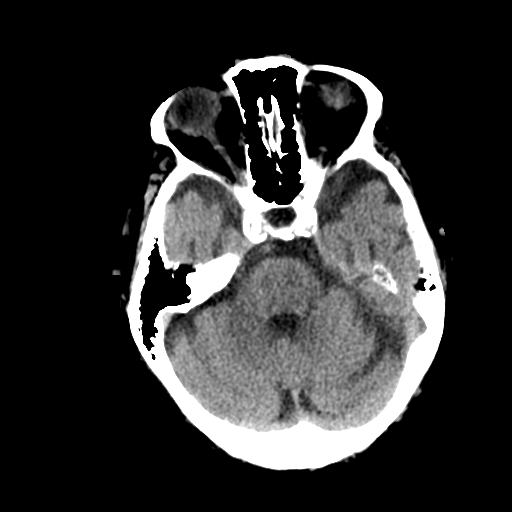
[im 11/32  brain]
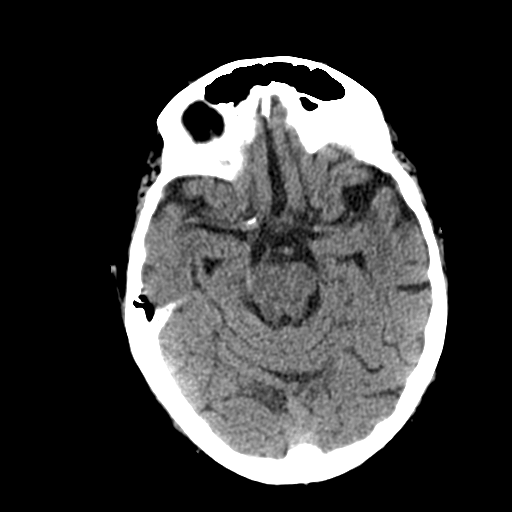
[im 14/32  brain]
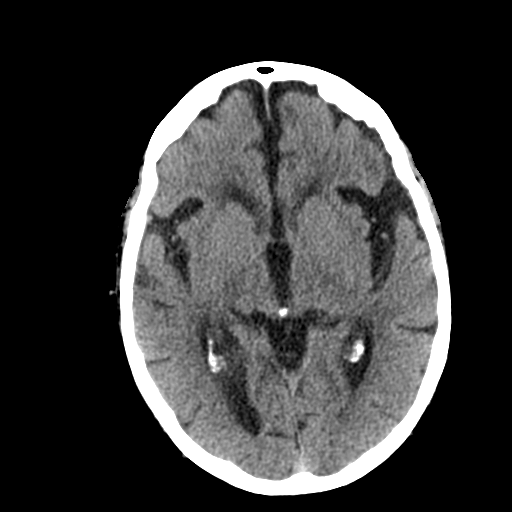
[im 14/32  bone]
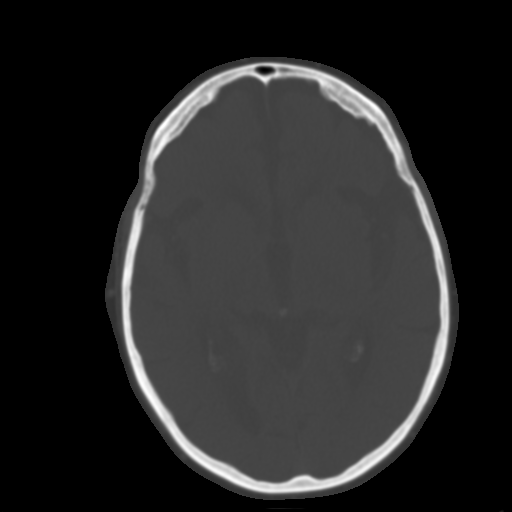
[im 18/32  brain]
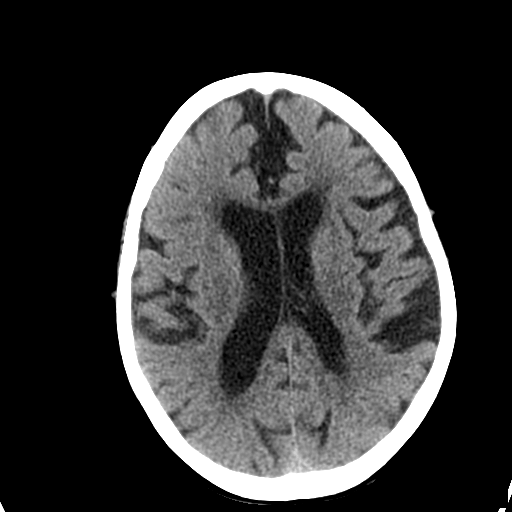
[im 21/32  brain]
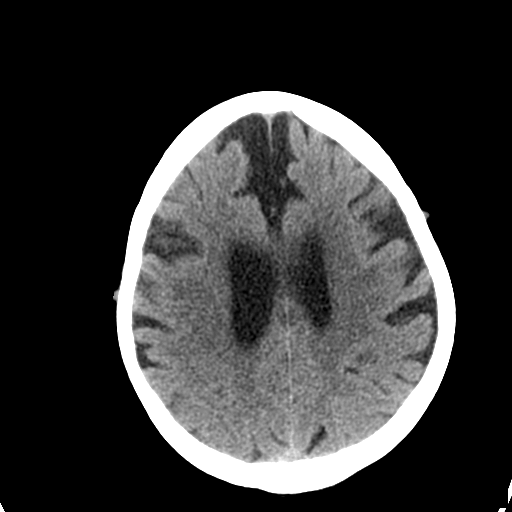
[im 24/32  brain]
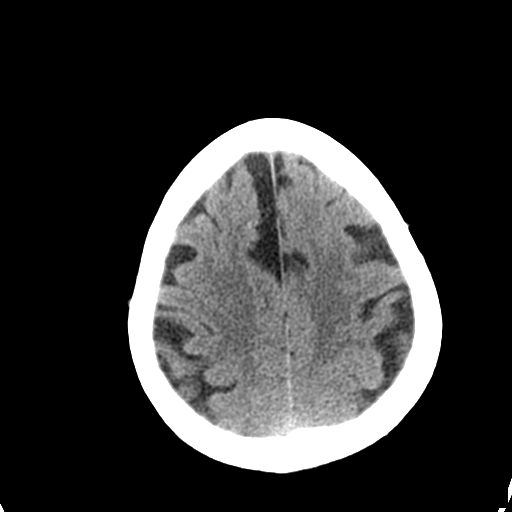
[im 26/32  brain]
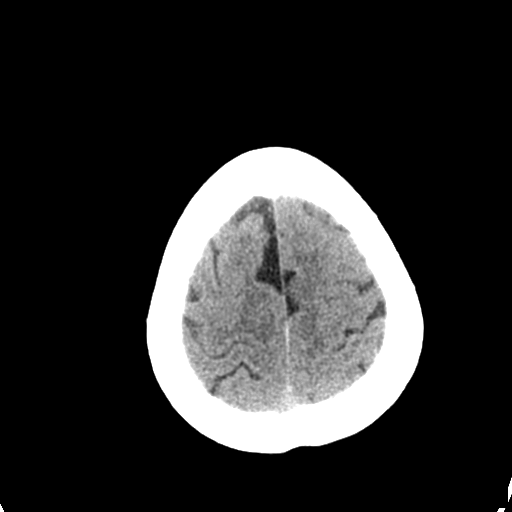
[im 26/32  bone]
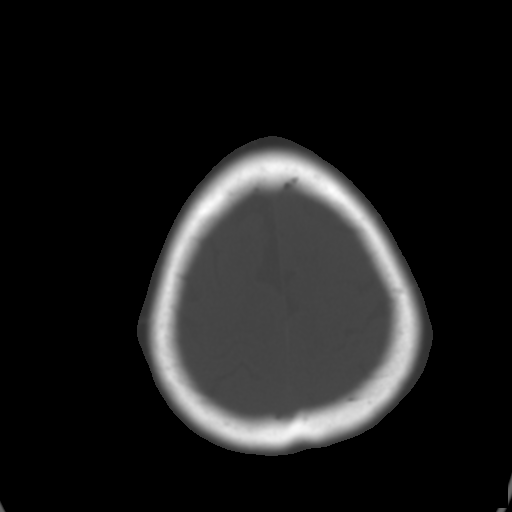
[im 29/32  brain]
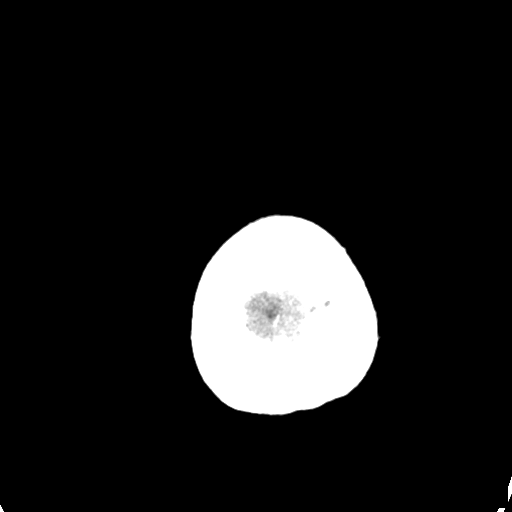

[Series 4: coronal soft tissue · coronal · 0.31mm/px · 3 of 66 slices shown]
[im 22/66  brain]
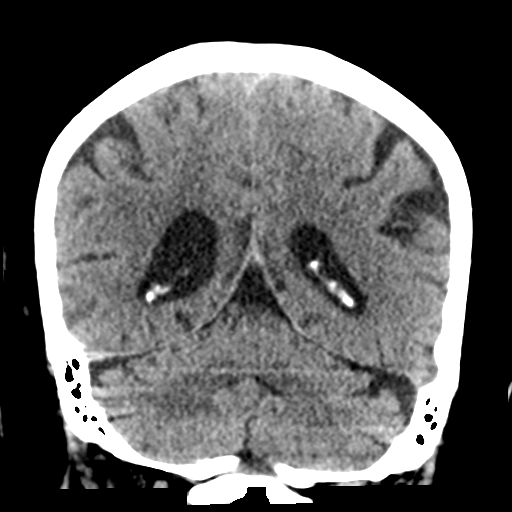
[im 29/66  brain]
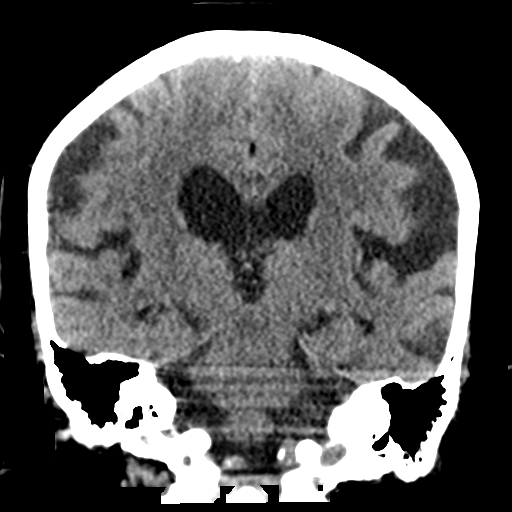
[im 37/66  brain]
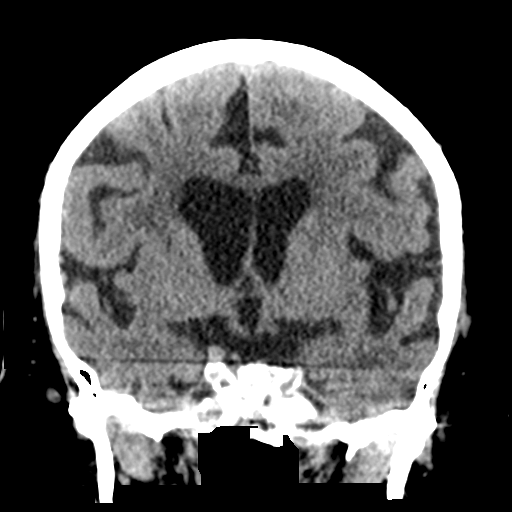

[Series 5: sagittal soft tissue · sagittal · 0.34mm/px · 3 of 54 slices shown]
[im 18/54  brain]
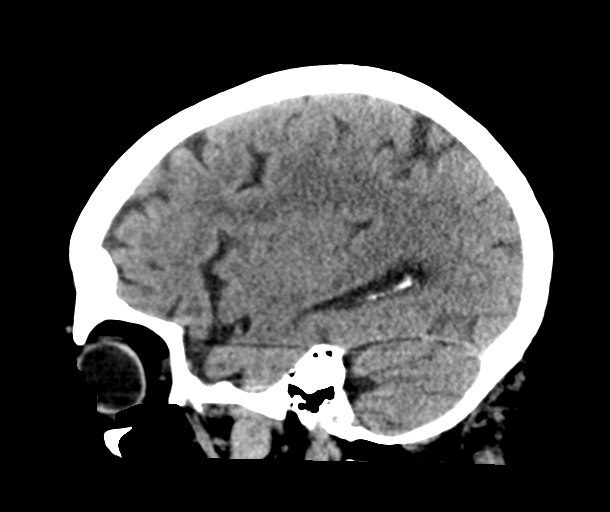
[im 27/54  brain]
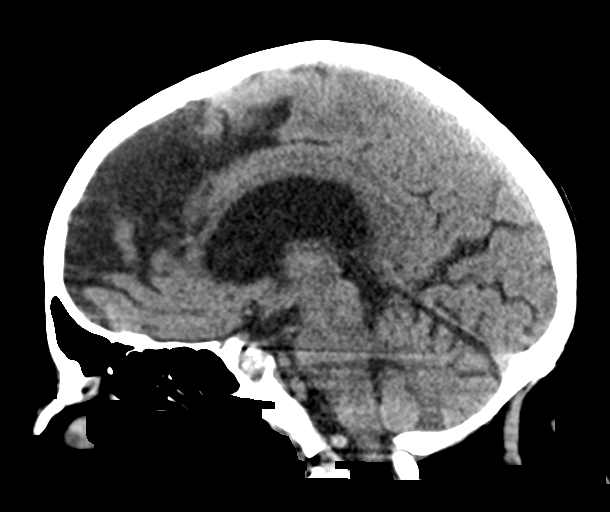
[im 36/54  brain]
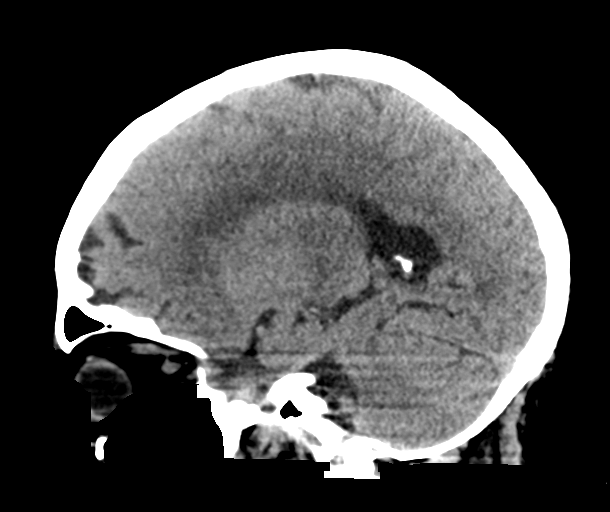

[16 of 47 positions shown; findings below may reference images not displayed]

FINDINGS: Brain: No evidence of acute infarction, hemorrhage, hydrocephalus,
extra-axial collection or mass lesion/mass effect. There is mild
diffuse low-attenuation within the subcortical and periventricular
white matter compatible with chronic microvascular disease.
Prominence of the sulci and ventricles identified.

Vascular: No hyperdense vessel or unexpected calcification.

Skull: Normal. Negative for fracture or focal lesion.

Sinuses/Orbits: The paranasal sinuses are clear. The mastoid air
cells are clear.

Other: None.
IMPRESSION: 1. No acute intracranial abnormality.
2. Chronic small vessel ischemic disease and brain atrophy.

## 2019-04-30 ENCOUNTER — Emergency Department (HOSPITAL_COMMUNITY)
Admission: EM | Admit: 2019-04-30 | Discharge: 2019-04-30 | Disposition: A | Discharge status: Home / Self Care | Payer: Medicare HMO | Attending: Emergency Medicine | Admitting: Emergency Medicine

## 2019-04-30 ENCOUNTER — Other Ambulatory Visit: Payer: Self-pay

## 2019-04-30 ENCOUNTER — Emergency Department (HOSPITAL_COMMUNITY): Payer: Medicare HMO

## 2019-04-30 ENCOUNTER — Encounter (HOSPITAL_COMMUNITY): Payer: Self-pay | Admitting: Emergency Medicine

## 2019-04-30 DIAGNOSIS — Z5321 Procedure and treatment not carried out due to patient leaving prior to being seen by health care provider: Secondary | ICD-10-CM | POA: Diagnosis not present

## 2019-04-30 DIAGNOSIS — R0602 Shortness of breath: Secondary | ICD-10-CM | POA: Diagnosis present

## 2019-04-30 LAB — BASIC METABOLIC PANEL
Anion gap: 9 (ref 5–15)
BUN: 26 mg/dL — ABNORMAL HIGH (ref 8–23)
CO2: 35 mmol/L — ABNORMAL HIGH (ref 22–32)
Calcium: 9.3 mg/dL (ref 8.9–10.3)
Chloride: 99 mmol/L (ref 98–111)
Creatinine, Ser: 1.51 mg/dL — ABNORMAL HIGH (ref 0.44–1.00)
GFR calc Af Amer: 36 mL/min — ABNORMAL LOW (ref >60)
GFR calc non Af Amer: 31 mL/min — ABNORMAL LOW (ref >60)
Glucose, Bld: 163 mg/dL — ABNORMAL HIGH (ref 70–99)
Potassium: 4 mmol/L (ref 3.5–5.1)
Sodium: 143 mmol/L (ref 135–145)

## 2019-04-30 LAB — CBC
HCT: 44.7 % (ref 36.0–46.0)
Hemoglobin: 13.5 g/dL (ref 12.0–15.0)
MCH: 29.8 pg (ref 26.0–34.0)
MCHC: 30.2 g/dL (ref 30.0–36.0)
MCV: 98.7 fL (ref 80.0–100.0)
Platelets: 188 10*3/uL (ref 150–400)
RBC: 4.53 MIL/uL (ref 3.87–5.11)
RDW: 13.4 % (ref 11.5–15.5)
WBC: 5.8 10*3/uL (ref 4.0–10.5)
nRBC: 0 % (ref 0.0–0.2)

## 2019-04-30 LAB — TROPONIN I (HIGH SENSITIVITY): Troponin I (High Sensitivity): 8 ng/L (ref <18)

## 2019-04-30 NOTE — ED Notes (Signed)
Patient advised that she had been here all day and was tired of waiting for a bed. Patient advised registration the she was leaving and that she felt better.

## 2019-04-30 NOTE — ED Triage Notes (Signed)
Pt reports intermittent sob, worse at night x 2 weeks.  Pt says tightness in abd and r arm.  Denies any cough or fever.

## 2019-10-23 ENCOUNTER — Other Ambulatory Visit: Payer: Self-pay

## 2019-10-23 ENCOUNTER — Emergency Department (HOSPITAL_COMMUNITY): Payer: Medicare HMO

## 2019-10-23 ENCOUNTER — Encounter: Payer: Self-pay | Admitting: Emergency Medicine

## 2019-10-23 ENCOUNTER — Ambulatory Visit
Admission: EM | Admit: 2019-10-23 | Discharge: 2019-10-23 | Disposition: A | Discharge status: Home / Self Care | Payer: Medicare HMO | Source: Home / Self Care

## 2019-10-23 ENCOUNTER — Encounter (HOSPITAL_COMMUNITY): Payer: Self-pay | Admitting: *Deleted

## 2019-10-23 ENCOUNTER — Inpatient Hospital Stay (HOSPITAL_COMMUNITY)
Admission: EM | Admit: 2019-10-23 | Discharge: 2019-10-27 | DRG: 291 | Disposition: A | Discharge status: Home Health | Payer: Medicare HMO | Attending: Internal Medicine | Admitting: Internal Medicine

## 2019-10-23 DIAGNOSIS — J441 Chronic obstructive pulmonary disease with (acute) exacerbation: Secondary | ICD-10-CM | POA: Diagnosis not present

## 2019-10-23 DIAGNOSIS — E119 Type 2 diabetes mellitus without complications: Secondary | ICD-10-CM

## 2019-10-23 DIAGNOSIS — R0600 Dyspnea, unspecified: Secondary | ICD-10-CM

## 2019-10-23 DIAGNOSIS — Z79899 Other long term (current) drug therapy: Secondary | ICD-10-CM

## 2019-10-23 DIAGNOSIS — E7800 Pure hypercholesterolemia, unspecified: Secondary | ICD-10-CM | POA: Diagnosis present

## 2019-10-23 DIAGNOSIS — M7989 Other specified soft tissue disorders: Secondary | ICD-10-CM | POA: Diagnosis present

## 2019-10-23 DIAGNOSIS — N1832 Chronic kidney disease, stage 3b: Secondary | ICD-10-CM | POA: Diagnosis present

## 2019-10-23 DIAGNOSIS — Z885 Allergy status to narcotic agent status: Secondary | ICD-10-CM

## 2019-10-23 DIAGNOSIS — E1122 Type 2 diabetes mellitus with diabetic chronic kidney disease: Secondary | ICD-10-CM | POA: Diagnosis present

## 2019-10-23 DIAGNOSIS — I4821 Permanent atrial fibrillation: Secondary | ICD-10-CM

## 2019-10-23 DIAGNOSIS — N183 Chronic kidney disease, stage 3 unspecified: Secondary | ICD-10-CM

## 2019-10-23 DIAGNOSIS — E785 Hyperlipidemia, unspecified: Secondary | ICD-10-CM | POA: Diagnosis present

## 2019-10-23 DIAGNOSIS — J9601 Acute respiratory failure with hypoxia: Secondary | ICD-10-CM

## 2019-10-23 DIAGNOSIS — Z6837 Body mass index (BMI) 37.0-37.9, adult: Secondary | ICD-10-CM

## 2019-10-23 DIAGNOSIS — I10 Essential (primary) hypertension: Secondary | ICD-10-CM | POA: Diagnosis present

## 2019-10-23 DIAGNOSIS — I5031 Acute diastolic (congestive) heart failure: Secondary | ICD-10-CM | POA: Diagnosis present

## 2019-10-23 DIAGNOSIS — Z888 Allergy status to other drugs, medicaments and biological substances status: Secondary | ICD-10-CM

## 2019-10-23 DIAGNOSIS — G4733 Obstructive sleep apnea (adult) (pediatric): Secondary | ICD-10-CM | POA: Diagnosis present

## 2019-10-23 DIAGNOSIS — I4891 Unspecified atrial fibrillation: Secondary | ICD-10-CM | POA: Diagnosis present

## 2019-10-23 DIAGNOSIS — Z8249 Family history of ischemic heart disease and other diseases of the circulatory system: Secondary | ICD-10-CM

## 2019-10-23 DIAGNOSIS — Z6841 Body Mass Index (BMI) 40.0 and over, adult: Secondary | ICD-10-CM

## 2019-10-23 DIAGNOSIS — Z79891 Long term (current) use of opiate analgesic: Secondary | ICD-10-CM

## 2019-10-23 DIAGNOSIS — M810 Age-related osteoporosis without current pathological fracture: Secondary | ICD-10-CM | POA: Diagnosis present

## 2019-10-23 DIAGNOSIS — I130 Hypertensive heart and chronic kidney disease with heart failure and stage 1 through stage 4 chronic kidney disease, or unspecified chronic kidney disease: Principal | ICD-10-CM | POA: Diagnosis present

## 2019-10-23 DIAGNOSIS — Z87891 Personal history of nicotine dependence: Secondary | ICD-10-CM

## 2019-10-23 DIAGNOSIS — Z794 Long term (current) use of insulin: Secondary | ICD-10-CM

## 2019-10-23 DIAGNOSIS — Z833 Family history of diabetes mellitus: Secondary | ICD-10-CM

## 2019-10-23 DIAGNOSIS — J449 Chronic obstructive pulmonary disease, unspecified: Secondary | ICD-10-CM

## 2019-10-23 DIAGNOSIS — Z9989 Dependence on other enabling machines and devices: Secondary | ICD-10-CM

## 2019-10-23 DIAGNOSIS — I482 Chronic atrial fibrillation, unspecified: Secondary | ICD-10-CM | POA: Diagnosis present

## 2019-10-23 DIAGNOSIS — Z20822 Contact with and (suspected) exposure to covid-19: Secondary | ICD-10-CM | POA: Diagnosis present

## 2019-10-23 LAB — CBC WITH DIFFERENTIAL/PLATELET
Abs Immature Granulocytes: 0.02 10*3/uL (ref 0.00–0.07)
Basophils Absolute: 0.1 10*3/uL (ref 0.0–0.1)
Basophils Relative: 1 %
Eosinophils Absolute: 0.2 10*3/uL (ref 0.0–0.5)
Eosinophils Relative: 4 %
HCT: 43.6 % (ref 36.0–46.0)
Hemoglobin: 13.1 g/dL (ref 12.0–15.0)
Immature Granulocytes: 0 %
Lymphocytes Relative: 29 %
Lymphs Abs: 1.6 10*3/uL (ref 0.7–4.0)
MCH: 29.6 pg (ref 26.0–34.0)
MCHC: 30 g/dL (ref 30.0–36.0)
MCV: 98.6 fL (ref 80.0–100.0)
Monocytes Absolute: 0.5 10*3/uL (ref 0.1–1.0)
Monocytes Relative: 9 %
Neutro Abs: 3.3 10*3/uL (ref 1.7–7.7)
Neutrophils Relative %: 57 %
Platelets: 201 10*3/uL (ref 150–400)
RBC: 4.42 MIL/uL (ref 3.87–5.11)
RDW: 14 % (ref 11.5–15.5)
WBC: 5.7 10*3/uL (ref 4.0–10.5)
nRBC: 0 % (ref 0.0–0.2)

## 2019-10-23 LAB — COMPREHENSIVE METABOLIC PANEL
ALT: 17 U/L (ref 0–44)
AST: 25 U/L (ref 15–41)
Albumin: 3.7 g/dL (ref 3.5–5.0)
Alkaline Phosphatase: 63 U/L (ref 38–126)
Anion gap: 9 (ref 5–15)
BUN: 29 mg/dL — ABNORMAL HIGH (ref 8–23)
CO2: 33 mmol/L — ABNORMAL HIGH (ref 22–32)
Calcium: 9 mg/dL (ref 8.9–10.3)
Chloride: 100 mmol/L (ref 98–111)
Creatinine, Ser: 1.54 mg/dL — ABNORMAL HIGH (ref 0.44–1.00)
GFR calc Af Amer: 35 mL/min — ABNORMAL LOW (ref >60)
GFR calc non Af Amer: 30 mL/min — ABNORMAL LOW (ref >60)
Glucose, Bld: 118 mg/dL — ABNORMAL HIGH (ref 70–99)
Potassium: 4.6 mmol/L (ref 3.5–5.1)
Sodium: 142 mmol/L (ref 135–145)
Total Bilirubin: 0.5 mg/dL (ref 0.3–1.2)
Total Protein: 6.6 g/dL (ref 6.5–8.1)

## 2019-10-23 LAB — GLUCOSE, CAPILLARY: Glucose-Capillary: 190 mg/dL — ABNORMAL HIGH (ref 70–99)

## 2019-10-23 LAB — TROPONIN I (HIGH SENSITIVITY)
Troponin I (High Sensitivity): 9 ng/L (ref <18)
Troponin I (High Sensitivity): 10 ng/L (ref <18)

## 2019-10-23 LAB — PROTIME-INR
INR: 2.3 — ABNORMAL HIGH (ref 0.8–1.2)
Prothrombin Time: 24.4 seconds — ABNORMAL HIGH (ref 11.4–15.2)

## 2019-10-23 LAB — BRAIN NATRIURETIC PEPTIDE: B Natriuretic Peptide: 192 pg/mL — ABNORMAL HIGH (ref 0.0–100.0)

## 2019-10-23 LAB — SARS CORONAVIRUS 2 BY RT PCR (HOSPITAL ORDER, PERFORMED IN ~~LOC~~ HOSPITAL LAB): SARS Coronavirus 2: NEGATIVE

## 2019-10-23 MED ORDER — ONDANSETRON HCL 4 MG/2ML IJ SOLN: 4.0000 mg | Freq: Four times a day (QID) | INTRAMUSCULAR | Status: DC | PRN

## 2019-10-23 MED ORDER — FUROSEMIDE 10 MG/ML IJ SOLN
40.0000 mg | Freq: Once | INTRAMUSCULAR | Status: AC
  Administered 2019-10-23: 40 mg via INTRAVENOUS
  Filled 2019-10-23: qty 4

## 2019-10-23 MED ORDER — FLUOROMETHOLONE 0.1 % OP SUSP
1.0000 [drp] | Freq: Every morning | OPHTHALMIC | Status: DC
  Administered 2019-10-24 – 2019-10-27 (×4): 1 [drp] via OPHTHALMIC
  Filled 2019-10-23: qty 5

## 2019-10-23 MED ORDER — LATANOPROST 0.005 % OP SOLN
1.0000 [drp] | Freq: Every day | OPHTHALMIC | Status: DC
  Administered 2019-10-23 – 2019-10-26 (×4): 1 [drp] via OPHTHALMIC
  Filled 2019-10-23: qty 2.5

## 2019-10-23 MED ORDER — INSULIN ASPART 100 UNIT/ML ~~LOC~~ SOLN
0.0000 [IU] | Freq: Three times a day (TID) | SUBCUTANEOUS | Status: DC
  Administered 2019-10-24: 5 [IU] via SUBCUTANEOUS
  Administered 2019-10-24: 1 [IU] via SUBCUTANEOUS
  Administered 2019-10-25: 5 [IU] via SUBCUTANEOUS
  Administered 2019-10-26 – 2019-10-27 (×3): 2 [IU] via SUBCUTANEOUS
  Administered 2019-10-27: 7 [IU] via SUBCUTANEOUS

## 2019-10-23 MED ORDER — ACETAMINOPHEN 650 MG RE SUPP: 650.0000 mg | Freq: Four times a day (QID) | RECTAL | Status: DC | PRN

## 2019-10-23 MED ORDER — IPRATROPIUM-ALBUTEROL 0.5-2.5 (3) MG/3ML IN SOLN: 3.0000 mL | RESPIRATORY_TRACT | Status: DC | PRN

## 2019-10-23 MED ORDER — ONDANSETRON HCL 4 MG PO TABS: 4.0000 mg | ORAL_TABLET | Freq: Four times a day (QID) | ORAL | Status: DC | PRN

## 2019-10-23 MED ORDER — POLYETHYLENE GLYCOL 3350 17 G PO PACK: 17.0000 g | PACK | Freq: Every day | ORAL | Status: DC | PRN

## 2019-10-23 MED ORDER — PRAVASTATIN SODIUM 10 MG PO TABS
20.0000 mg | ORAL_TABLET | Freq: Every day | ORAL | Status: DC
  Administered 2019-10-23 – 2019-10-26 (×4): 20 mg via ORAL
  Filled 2019-10-23 (×4): qty 2

## 2019-10-23 MED ORDER — ACETAMINOPHEN 325 MG PO TABS: 650.0000 mg | ORAL_TABLET | Freq: Four times a day (QID) | ORAL | Status: DC | PRN

## 2019-10-23 MED ORDER — INSULIN ASPART 100 UNIT/ML ~~LOC~~ SOLN: 0.0000 [IU] | Freq: Every day | SUBCUTANEOUS | Status: DC

## 2019-10-23 MED ORDER — WARFARIN SODIUM 2 MG PO TABS
4.0000 mg | ORAL_TABLET | Freq: Once | ORAL | Status: AC
  Administered 2019-10-23: 4 mg via ORAL
  Filled 2019-10-23: qty 2

## 2019-10-23 MED ORDER — FUROSEMIDE 10 MG/ML IJ SOLN
40.0000 mg | Freq: Two times a day (BID) | INTRAMUSCULAR | Status: DC
  Administered 2019-10-24 – 2019-10-27 (×7): 40 mg via INTRAVENOUS
  Filled 2019-10-23 (×7): qty 4

## 2019-10-23 MED ORDER — WARFARIN - PHARMACIST DOSING INPATIENT: Freq: Every day | Status: DC

## 2019-10-23 MED ORDER — ALBUTEROL SULFATE HFA 108 (90 BASE) MCG/ACT IN AERS
2.0000 | INHALATION_SPRAY | Freq: Once | RESPIRATORY_TRACT | Status: AC
  Administered 2019-10-23: 2 via RESPIRATORY_TRACT
  Filled 2019-10-23: qty 6.7

## 2019-10-23 MED ORDER — INSULIN GLARGINE 100 UNIT/ML ~~LOC~~ SOLN
10.0000 [IU] | Freq: Every morning | SUBCUTANEOUS | Status: DC
  Administered 2019-10-24 – 2019-10-27 (×4): 10 [IU] via SUBCUTANEOUS
  Filled 2019-10-23 (×5): qty 0.1

## 2019-10-23 MED ORDER — LOSARTAN POTASSIUM 50 MG PO TABS: 50.0000 mg | ORAL_TABLET | Freq: Every day | ORAL | Status: DC

## 2019-10-23 MED ORDER — CARVEDILOL 12.5 MG PO TABS
12.5000 mg | ORAL_TABLET | Freq: Two times a day (BID) | ORAL | Status: DC
  Administered 2019-10-23: 12.5 mg via ORAL
  Filled 2019-10-23: qty 1

## 2019-10-23 MED ORDER — MOMETASONE FURO-FORMOTEROL FUM 200-5 MCG/ACT IN AERO
2.0000 | INHALATION_SPRAY | Freq: Two times a day (BID) | RESPIRATORY_TRACT | Status: DC
  Administered 2019-10-23 – 2019-10-27 (×8): 2 via RESPIRATORY_TRACT
  Filled 2019-10-23: qty 8.8

## 2019-10-23 NOTE — Progress Notes (Signed)
ANTICOAGULATION CONSULT NOTE - Initial Consult  Pharmacy Consult for warfarin Indication: atrial fibrillation  Allergies  Allergen Reactions  . Codeine Nausea And Vomiting  . Percocet [Oxycodone-Acetaminophen] Nausea And Vomiting  . Tramadol Nausea And Vomiting  . Vicodin [Hydrocodone-Acetaminophen] Nausea And Vomiting    Patient Measurements: Height: 5' (152.4 cm) Weight: 94.8 kg (209 lb) IBW/kg (Calculated) : 45.5  Vital Signs: Temp: 98.1 F (36.7 C) (06/02 1105) Temp Source: Oral (06/02 1105) BP: 136/70 (06/02 1730) Pulse Rate: 83 (06/02 1730)  Labs: Recent Labs    10/23/19 1149 10/23/19 1155 10/23/19 1343  HGB 13.1  --   --   HCT 43.6  --   --   PLT 201  --   --   LABPROT  --  24.4*  --   INR  --  2.3*  --   CREATININE 1.54*  --   --   TROPONINIHS 9  --  10    Estimated Creatinine Clearance: 26.5 mL/min (A) (by C-G formula based on SCr of 1.54 mg/dL (H)).   Medical History: Past Medical History:  Diagnosis Date  . A-fib ([place])   . Arthritis   . Diabetes mellitus   . Full dentures   . High cholesterol   . Hypertension   . Osteoporosis   . Renal disorder   . Wears glasses     Assessment: 86 yo W on warfarin PTA for afib (CHADS2VASc = 5). Last dose 6/1. Admit INR was therapeutic at 2.3. Last outpatient INR was 4.5 on 09/23/19. H/H and platelets stable.   PTA warfarin dose = 4mg  daily   Goal of Therapy:  INR 2-3 Monitor platelets by anticoagulation protocol: Yes   Plan:  Warfarin 4mg  x1 Monitor daily INR, CBC/plt Monitor for signs/symptoms of bleeding    Benetta Spar, PharmD, BCPS, BCCP Clinical Pharmacist  Please check AMION for all [place] phone numbers After 10:00 PM, call [place]

## 2019-10-23 NOTE — ED Notes (Signed)
Patient ambulatory to restroom.  Patient became very short of breath with respirations at 24.  o2 sat upon returning to the room was 89% on room air.

## 2019-10-23 NOTE — ED Triage Notes (Signed)
Pt having shortness of breath with activity and when laying in bed at night x 2 months.  Edema to bilateral lower extremities.

## 2019-10-23 NOTE — ED Notes (Signed)
Patient is being discharged from the Urgent Care and sent to the Emergency Department via pov . Per [place], [place], patient is in need of higher level of care due to shortness of breath. Patient is aware and verbalizes understanding of plan of care.  Vitals:   10/23/19 1032  BP: (!) 149/64  Pulse: 82  Resp: 20  Temp: 98 F (36.7 C)  SpO2: 93%

## 2019-10-23 NOTE — ED Provider Notes (Signed)
[facility] EMERGENCY DEPARTMENT Provider Note   CSN: [id number] Arrival date & time: 10/23/19  1058     History Chief Complaint  Patient presents with  . Shortness of Breath    Cynthia James is a 86 y.o. female.  Patient complains of shortness of breath.  Patient has a history of COPD atrial fibrillation and she has been having swelling in her legs  The history is provided by the patient. No language interpreter was used.  Shortness of Breath Severity:  Mild Onset quality:  Sudden Timing:  Constant Progression:  Worsening Chronicity:  New Context: activity   Relieved by:  Nothing Worsened by:  Nothing Ineffective treatments:  None tried Associated symptoms: no abdominal pain, no chest pain, no cough, no headaches and no rash        Past Medical History:  Diagnosis Date  . A-fib ([place])   . Arthritis   . Diabetes mellitus   . Full dentures   . High cholesterol   . Hypertension   . Osteoporosis   . Renal disorder   . Wears glasses     Patient Active Problem List   Diagnosis Date Noted  . Atrial fibrillation ([place]) 08/22/2012  . Essential hypertension, benign 08/22/2012  . Diabetes ([place]) 08/22/2012  . Pure hypercholesterolemia 08/22/2012    Past Surgical History:  Procedure Laterality Date  . ABDOMINAL HYSTERECTOMY    . APPENDECTOMY    . [place]   rt/lt  . CATARACT EXTRACTION     bilateral  . EYE SURGERY    . ORIF FEMUR FRACTURE  1986   lt-hip  . ORIF [place]   right  . TONSILLECTOMY    . TRIGGER FINGER RELEASE Right 04/01/2013   Procedure: RELEASE TRIGGER FINGER/A-1 PULLEY RIGHT LONG FINGER;  Surgeon: Tennis Must, MD;  Location: [place];  Service: Orthopedics;  Laterality: Right;     OB History    Gravida  4   Para  3   Term  3   Preterm      AB  1   Living  3     SAB  1   TAB      Ectopic      Multiple      Live Births              Family History    Problem Relation Age of Onset  . Diabetes Mother   . Heart failure Father   . Diabetes Brother   . Cancer Brother     Social History   Tobacco Use  . Smoking status: Former Smoker    Packs/day: 1.50    Years: 20.00    Pack years: 30.00    Types: Cigarettes    Quit date: 08/11/1994    Years since quitting: 25.2  . Smokeless tobacco: Never Used  Substance Use Topics  . Alcohol use: No  . Drug use: No    Home Medications Prior to Admission medications   Medication Sig Start Date End Date Taking? Authorizing Provider  albuterol (PROAIR HFA) 108 (90 BASE) MCG/ACT inhaler Inhale 1-2 puffs into the lungs every 6 (six) hours as needed for wheezing or shortness of breath. 04/19/13   Virgel Manifold, MD  calcium carbonate (OSCAL) 1500 (600 Ca) MG TABS tablet Take by mouth.    [provider]  carvedilol (COREG) 12.5 MG tablet Take 12.5 mg by mouth 2 (two) times daily. 10/10/19  [provider]  diltiazem (DILACOR XR) 240 MG 24 hr capsule Take 240 mg by mouth every morning.     [provider]  erythromycin ophthalmic ointment  10/02/19   [provider]  fluorometholone (FML) 0.1 % ophthalmic suspension  09/24/19   [provider]  furosemide (LASIX) 20 MG tablet Take 20 mg by mouth daily. 08/11/19   [provider]  gabapentin (NEURONTIN) 300 MG capsule Take 300 mg by mouth daily. 07/23/19   [provider]  HYDROcodone-acetaminophen (NORCO) 5-325 MG per tablet 1-2 tabs po q6 hours prn pain 04/01/13   Leanora Cover, MD  insulin glargine (LANTUS) 100 UNIT/ML injection Inject 24 Units into the skin at bedtime.     [provider]  latanoprost (XALATAN) 0.005 % ophthalmic solution  08/23/19   [provider]  losartan (COZAAR) 50 MG tablet Take 50 mg by mouth daily. 06/04/19   [provider]  losartan-hydrochlorothiazide (HYZAAR) 50-12.5 MG per tablet Take 1 tablet by mouth daily.    [provider]   meclizine (ANTIVERT) 25 MG tablet Take 1 tablet (25 mg total) by mouth 3 (three) times daily as needed for dizziness. 5/78/46   [name], MD  Omega-3 Fatty Acids (FISH OIL PEARLS) 150 MG CAPS Take by mouth.    [provider]  pravastatin (PRAVACHOL) 20 MG tablet Take 20 mg by mouth at bedtime.    [provider]  predniSONE (DELTASONE) 50 MG tablet Take 1 tablet (50 mg total) by mouth daily. 04/19/13   Virgel Manifold, MD  tobramycin (TOBREX) 0.3 % ophthalmic solution Place 1 drop into both eyes daily.    [provider]  warfarin (COUMADIN) 5 MG tablet Take 5 mg by mouth daily.     [provider]  Grant Ruts INHUB 250-50 MCG/DOSE AEPB Inhale 1 puff into the lungs 2 (two) times daily. 05/11/19   [provider]    Allergies    Codeine, Percocet [oxycodone-acetaminophen], Tramadol, and Vicodin [hydrocodone-acetaminophen]  Review of Systems   Review of Systems  Constitutional: Negative for appetite change and fatigue.  HENT: Negative for congestion, ear discharge and sinus pressure.   Eyes: Negative for discharge.  Respiratory: Positive for shortness of breath. Negative for cough.   Cardiovascular: Negative for chest pain.  Gastrointestinal: Negative for abdominal pain and diarrhea.  Genitourinary: Negative for frequency and hematuria.  Musculoskeletal: Negative for back pain.  Skin: Negative for rash.  Neurological: Negative for seizures and headaches.  Psychiatric/Behavioral: Negative for hallucinations.    Physical Exam Updated Vital Signs BP (!) 147/77   Pulse (!) 116   Temp 98.1 F (36.7 C) (Oral)   Resp 19   Ht 5' (1.524 m)   Wt 94.8 kg   SpO2 99%   BMI 40.82 kg/m   Physical Exam Vitals reviewed.  Constitutional:      Appearance: She is well-developed.  HENT:     Head: Normocephalic.     Nose: Nose normal.  Eyes:     General: No scleral icterus.    Conjunctiva/sclera: Conjunctivae normal.  Neck:     Thyroid: No  thyromegaly.  Cardiovascular:     Rate and Rhythm: Normal rate and regular rhythm.     Heart sounds: No murmur. No friction rub. No gallop.   Pulmonary:     Breath sounds: No stridor. No wheezing or rales.  Chest:     Chest wall: No tenderness.  Abdominal:     General: There is no distension.  Tenderness: There is no abdominal tenderness. There is no rebound.  Musculoskeletal:        General: Normal range of motion.     Cervical back: Neck supple.     Comments: Edema in legs  Lymphadenopathy:     Cervical: No cervical adenopathy.  Skin:    Findings: No erythema or rash.  Neurological:     Mental Status: She is alert and oriented to person, place, and time.     Motor: No abnormal muscle tone.     Coordination: Coordination normal.  Psychiatric:        Behavior: Behavior normal.     ED Results / Procedures / Treatments   Labs (all labs ordered are listed, but only abnormal results are displayed) Labs Reviewed  COMPREHENSIVE METABOLIC PANEL - Abnormal; Notable for the following components:      Result Value   CO2 33 (*)    Glucose, Bld 118 (*)    BUN 29 (*)    Creatinine, Ser 1.54 (*)    GFR calc non Af Amer 30 (*)    GFR calc Af Amer 35 (*)    All other components within normal limits  BRAIN NATRIURETIC PEPTIDE - Abnormal; Notable for the following components:   B Natriuretic Peptide 192.0 (*)    All other components within normal limits  PROTIME-INR - Abnormal; Notable for the following components:   Prothrombin Time 24.4 (*)    INR 2.3 (*)    All other components within normal limits  SARS CORONAVIRUS 2 BY RT PCR (HOSPITAL ORDER, [facility])  CBC WITH DIFFERENTIAL/PLATELET  TROPONIN I (HIGH SENSITIVITY)  TROPONIN I (HIGH SENSITIVITY)    EKG EKG Interpretation  Date/Time:  Wednesday October 23 2019 11:17:23 EDT Ventricular Rate:  63 PR Interval:    QRS Duration: 66 QT Interval:  374 QTC Calculation: 382 R Axis:   40 Text  Interpretation: Atrial fibrillation Low voltage QRS Cannot rule out Anterior infarct , age undetermined Abnormal ECG Confirmed by Milton Ferguson [phone]) on 10/23/2019 2:51:45 PM   Radiology DG Chest Port 1 View  Result Date: 10/23/2019 CLINICAL DATA:  Shortness of breath. EXAM: PORTABLE CHEST 1 VIEW COMPARISON:  06/30/2015 chest radiograph. FINDINGS: Left basilar scarring, unchanged. No new focal airspace opacity. No pneumothorax or pleural effusion. Mild central pulmonary vascular congestion. Aortic atherosclerotic calcifications. Cardiomediastinal silhouette is otherwise unremarkable. No acute osseous abnormality. IMPRESSION: Mild central pulmonary vascular congestion without frank edema. Left basilar scarring, unchanged. Electronically Signed   By: Primitivo Gauze M.D.   On: 10/23/2019 12:49    Procedures Procedures (including critical care time)  Medications Ordered in ED Medications  furosemide (LASIX) injection 40 mg (has no administration in time range)  albuterol (VENTOLIN HFA) 108 (90 Base) MCG/ACT inhaler 2 puff (has no administration in time range)    ED Course  I have reviewed the triage vital signs and the nursing notes.  Pertinent labs & imaging results that were available during my care of the patient were reviewed by me and considered in my medical decision making (see chart for details).    MDM Rules/Calculators/A&P                      Patient with exacerbation of COPD mild heart failure.  She is given some Lasix and albuterol will be admitted to medicine for hypoxia.  Patient sats were in the 80s while ambulating       This patient presents  to the ED for concern of shortness of breath this involves an extensive number of treatment options, and is a complaint that carries with it a high risk of complications and morbidity.  The differential diagnosis includes pneumonia heart failure   Lab Tests:   I Ordered, reviewed, and interpreted labs, which included CBC  and chemistries which showed elevated BUN and creatinine along with a BNP of mildly elevation of 192  Medicines ordered:   I ordered medication Lasix for fluids and albuterol for COPD  Imaging Studies ordered:   I ordered imaging studies which included chest x-ray and  I independently visualized and interpreted imaging which showed unremarkable  Additional history obtained:   Additional history obtained from relative  Previous records obtained and reviewed   Consultations Obtained:   I consulted hospitalist and discussed lab and imaging findings  Reevaluation:  After the interventions stated above, I reevaluated the patient and found mildly improved  Critical Interventions:  .   Final Clinical Impression(s) / ED Diagnoses Final diagnoses:  COPD exacerbation [facility])    Rx / [place] Orders ED Discharge Orders    None       Milton Ferguson, MD 10/23/19 1530

## 2019-10-23 NOTE — H&P (Addendum)
History and Physical    Cynthia James NOM:[id number] DOB: 02-02-1930 DOA: 10/23/2019  PCP: Abran Richard, MD   Patient coming from: Home  I have personally briefly reviewed patient's old medical records in [place]  Chief Complaint: Difficulty breathing  HPI: Cynthia James is a 86 y.o. female with medical history significant for Atria fibrillation, diabetes mellitus, hypertension, dyslipidemia.  To the ED with complaints of progressive difficulty breathing over the past 2 months.  Difficulty breathing is worse with exertion and when she lies down to sleep.  She reports that when she uses her CPAP at night to sleep, she is able to sleep lying down, why she cannot.  She has chronic lower extremity swelling that she does not think has changed.  She is unaware of any weight changes.  No chest pain, no cough, denies wheezing. She is compliant with her medications including warfarin.  She believes she is also on the fluid pill extremity swelling, but she is unsure if it is Lasix-as listed on her medication list.  She is not on home O2.  ED Course: O2 sats greater than 94% on room air at rest, but with ambulation patient's O2 sats dropped to 89%, and she became more dyspneic.  BNP 192, no prior to compare.  Troponin flat 9 > 10.  Creatinine 1.54, at baseline.  INR therapeutic at 2.3.  EKG shows atrial fibrillation rate 63.  Portable chest x-ray shows mild central pulmonary vascular congestion without frank edema.  IV Lasix 40 mg x 1 given in the ED, hospitalist to admit for further evaluation and management.  Review of Systems: As per HPI all other systems reviewed and negative.  Past Medical History:  Diagnosis Date  . A-fib ([place])   . Arthritis   . Diabetes mellitus   . Full dentures   . High cholesterol   . Hypertension   . Osteoporosis   . Renal disorder   . Wears glasses     Past Surgical History:  Procedure Laterality Date  . ABDOMINAL HYSTERECTOMY    .  APPENDECTOMY    . [place]   rt/lt  . CATARACT EXTRACTION     bilateral  . EYE SURGERY    . ORIF FEMUR FRACTURE  1986   lt-hip  . ORIF [place]   right  . TONSILLECTOMY    . TRIGGER FINGER RELEASE Right 04/01/2013   Procedure: RELEASE TRIGGER FINGER/A-1 PULLEY RIGHT LONG FINGER;  Surgeon: Tennis Must, MD;  Location: [facility];  Service: Orthopedics;  Laterality: Right;     reports that she quit smoking about 25 years ago. Her smoking use included cigarettes. She has a 30.00 pack-year smoking history. She has never used smokeless tobacco. She reports that she does not drink alcohol or use drugs.  Allergies  Allergen Reactions  . Codeine Nausea And Vomiting  . Percocet [Oxycodone-Acetaminophen] Nausea And Vomiting  . Tramadol Nausea And Vomiting  . Vicodin [Hydrocodone-Acetaminophen] Nausea And Vomiting    Family History  Problem Relation Age of Onset  . Diabetes Mother   . Heart failure Father   . Diabetes Brother   . Cancer Brother     Prior to Admission medications   Medication Sig Start Date End Date Taking? Authorizing Provider  albuterol (PROAIR HFA) 108 (90 BASE) MCG/ACT inhaler Inhale 1-2 puffs into the lungs every 6 (six) hours as needed for wheezing or shortness of breath. 04/19/13  Virgel Manifold, MD  calcium carbonate (OSCAL) 1500 (600 Ca) MG TABS tablet Take by mouth.    [provider]  carvedilol (COREG) 12.5 MG tablet Take 12.5 mg by mouth 2 (two) times daily. 10/10/19   [provider]  diltiazem (DILACOR XR) 240 MG 24 hr capsule Take 240 mg by mouth every morning.     [provider]  erythromycin ophthalmic ointment  10/02/19   [provider]  fluorometholone (FML) 0.1 % ophthalmic suspension  09/24/19   [provider]  furosemide (LASIX) 20 MG tablet Take 20 mg by mouth daily. 08/11/19   [provider]  gabapentin (NEURONTIN) 300 MG capsule Take  300 mg by mouth daily. 07/23/19   [provider]  HYDROcodone-acetaminophen (NORCO) 5-325 MG per tablet 1-2 tabs po q6 hours prn pain 04/01/13   Leanora Cover, MD  insulin glargine (LANTUS) 100 UNIT/ML injection Inject 24 Units into the skin at bedtime.     [provider]  latanoprost (XALATAN) 0.005 % ophthalmic solution  08/23/19   [provider]  losartan (COZAAR) 50 MG tablet Take 50 mg by mouth daily. 06/04/19   [provider]  losartan-hydrochlorothiazide (HYZAAR) 50-12.5 MG per tablet Take 1 tablet by mouth daily.    [provider]  meclizine (ANTIVERT) 25 MG tablet Take 1 tablet (25 mg total) by mouth 3 (three) times daily as needed for dizziness. 1/61/09   [name], MD  Omega-3 Fatty Acids (FISH OIL PEARLS) 150 MG CAPS Take by mouth.    [provider]  pravastatin (PRAVACHOL) 20 MG tablet Take 20 mg by mouth at bedtime.    [provider]  predniSONE (DELTASONE) 50 MG tablet Take 1 tablet (50 mg total) by mouth daily. 04/19/13   Virgel Manifold, MD  tobramycin (TOBREX) 0.3 % ophthalmic solution Place 1 drop into both eyes daily.    [provider]  warfarin (COUMADIN) 5 MG tablet Take 5 mg by mouth daily.     [provider]  Grant Ruts INHUB 250-50 MCG/DOSE AEPB Inhale 1 puff into the lungs 2 (two) times daily. 05/11/19   [provider]    Physical Exam: Vitals:   10/23/19 1109 10/23/19 1200 10/23/19 1230 10/23/19 1300  BP:  (!) 111/92 129/68 (!) 147/77  Pulse:  76 66 (!) 116  Resp:  (!) 22 (!) 22 19  Temp:      TempSrc:      SpO2:  93% 99% 99%  Weight: 94.8 kg     Height: 5' (1.524 m)       Constitutional: NAD, calm, comfortable Vitals:   10/23/19 1109 10/23/19 1200 10/23/19 1230 10/23/19 1300  BP:  (!) 111/92 129/68 (!) 147/77  Pulse:  76 66 (!) 116  Resp:  (!) 22 (!) 22 19  Temp:      TempSrc:      SpO2:  93% 99% 99%  Weight: 94.8 kg     Height: 5' (1.524 m)      Eyes: PERRL,  lids and conjunctivae normal ENMT: Mucous membranes are moist.  Neck: normal, supple, no masses, no thyromegaly Respiratory: Bilateral basilar crackles, no wheezing or rhonchi appreciated,  Normal respiratory effort. No accessory muscle use.  Cardiovascular: irregular rate and rhythm, no murmurs / rubs / gallops.  1+ pitting extremity edema to knees with chronic stasis changes, lower extremities warm and well-perfused Abdomen: no tenderness, no masses palpated. No hepatosplenomegaly. Bowel sounds positive.  Musculoskeletal: no clubbing / cyanosis. No  joint deformity upper and lower extremities. Good ROM, no contractures. Normal muscle tone.  Skin: no rashes, lesions, ulcers. No induration Neurologic: no apparent cranial nerve abnormality, moving all extremities spontaneously Psychiatric: Normal judgment and insight. Alert and oriented x 3. Normal mood.   Labs on Admission: I have personally reviewed following labs and imaging studies  CBC: Recent Labs  Lab 10/23/19 1149  WBC 5.7  NEUTROABS 3.3  HGB 13.1  HCT 43.6  MCV 98.6  PLT 433   Basic Metabolic Panel: Recent Labs  Lab 10/23/19 1149  NA 142  K 4.6  CL 100  CO2 33*  GLUCOSE 118*  BUN 29*  CREATININE 1.54*  CALCIUM 9.0   Liver Function Tests: Recent Labs  Lab 10/23/19 1149  AST 25  ALT 17  ALKPHOS 63  BILITOT 0.5  PROT 6.6  ALBUMIN 3.7   Coagulation Profile: Recent Labs  Lab 10/23/19 1155  INR 2.3*    Radiological Exams on Admission: DG Chest Port 1 View  Result Date: 10/23/2019 CLINICAL DATA:  Shortness of breath. EXAM: PORTABLE CHEST 1 VIEW COMPARISON:  06/30/2015 chest radiograph. FINDINGS: Left basilar scarring, unchanged. No new focal airspace opacity. No pneumothorax or pleural effusion. Mild central pulmonary vascular congestion. Aortic atherosclerotic calcifications. Cardiomediastinal silhouette is otherwise unremarkable. No acute osseous abnormality. IMPRESSION: Mild central pulmonary vascular  congestion without frank edema. Left basilar scarring, unchanged. Electronically Signed   By: Primitivo Gauze M.D.   On: 10/23/2019 12:49    EKG: Independently reviewed.  Atrial fibrillation,, rate 63, TC 382.  No significant ST-T wave changes compared to prior EKG.  Assessment/Plan Principal Problem:   Dyspnea Active Problems:   Atrial fibrillation (H[facility])   Essential hypertension, benign   Diabetes (HCC)   COPD (chronic obstructive pulmonary disease) (H[facility])   OSA on CPAP   Dyspnea, acute respiratory failure- appears to be more of a chronic issue. O2 sats 89% with ambulation, chronic bilateral lower extremity swelling, likely secondary to congestive heart failure versus just needing supplimental oxygen for chronic lung disease. Mild BNP elevation of 192 no prior to compare, and portable chest x-ray showing mild central pulmonary vascular congestion without frank edema.  Per charts weight are stable.  Home medication has Lasix 20 mg daily.  No wheezing or rhonchi to suggest COPD or history of same.  -  She is on anticoagulation with warfarin and INR is therapeutic at 2.3, hence low suspicion for PE.  - Dyspea also documented in care-everywhere per last cardiologist visit 2019, with last Echo from 2018 showing EF of 60 to 65% with normal systolic function, moderate tricuspid regurgitation. - Currently feels much better with supplimental o2 in ED - IV Lasix 40 mg every 12 hourly -Strict input- Output  - Daily weights -BMP in a.m. -Obtain echocardiogram, check valves also -Resume Home bronchodilators  Atrial fibrillation-rate controlled on anticoagulated.  INR therapeutic at 2.3. -Resume Cardizem, carvedilol -Resume warfarin, pharmacy to dose  Diabetes mellitus-random glucose 118 - Hgba1c - SSI- S - Resume lantus at reduced dose 10u daily ( home dose 20u).  Hypertension-stable. -Resume home Cardizem, carvedilol, losartan -IV Lasix  OSA- - resume CPAP QHS  COPD-  stable, no  wheezing on exam. - Resume home brochodilators  DVT prophylaxis: Warfarin Code Status: Full code Family Communication: Friend at bedside Disposition Plan: 1 to 2 days Consults called: None Admission status: Observation, telemetry   Bethena Roys MD [facility]  10/23/2019, 7:06 PM

## 2019-10-23 NOTE — ED Triage Notes (Signed)
Pt c/o SOB mainly with exertion and laying down at night x months. Pt has BLE edema which pt says is her normal.

## 2019-10-24 ENCOUNTER — Observation Stay (HOSPITAL_COMMUNITY): Payer: Medicare HMO

## 2019-10-24 DIAGNOSIS — I130 Hypertensive heart and chronic kidney disease with heart failure and stage 1 through stage 4 chronic kidney disease, or unspecified chronic kidney disease: Secondary | ICD-10-CM | POA: Diagnosis present

## 2019-10-24 DIAGNOSIS — Z79891 Long term (current) use of opiate analgesic: Secondary | ICD-10-CM | POA: Diagnosis not present

## 2019-10-24 DIAGNOSIS — Z9989 Dependence on other enabling machines and devices: Secondary | ICD-10-CM

## 2019-10-24 DIAGNOSIS — Z888 Allergy status to other drugs, medicaments and biological substances status: Secondary | ICD-10-CM | POA: Diagnosis not present

## 2019-10-24 DIAGNOSIS — Z87891 Personal history of nicotine dependence: Secondary | ICD-10-CM | POA: Diagnosis not present

## 2019-10-24 DIAGNOSIS — M7989 Other specified soft tissue disorders: Secondary | ICD-10-CM | POA: Diagnosis present

## 2019-10-24 DIAGNOSIS — Z79899 Other long term (current) drug therapy: Secondary | ICD-10-CM | POA: Diagnosis not present

## 2019-10-24 DIAGNOSIS — J9601 Acute respiratory failure with hypoxia: Secondary | ICD-10-CM | POA: Diagnosis present

## 2019-10-24 DIAGNOSIS — N1832 Chronic kidney disease, stage 3b: Secondary | ICD-10-CM | POA: Diagnosis present

## 2019-10-24 DIAGNOSIS — Z8249 Family history of ischemic heart disease and other diseases of the circulatory system: Secondary | ICD-10-CM | POA: Diagnosis not present

## 2019-10-24 DIAGNOSIS — I482 Chronic atrial fibrillation, unspecified: Secondary | ICD-10-CM | POA: Diagnosis present

## 2019-10-24 DIAGNOSIS — Z20822 Contact with and (suspected) exposure to covid-19: Secondary | ICD-10-CM | POA: Diagnosis present

## 2019-10-24 DIAGNOSIS — R0600 Dyspnea, unspecified: Secondary | ICD-10-CM | POA: Diagnosis not present

## 2019-10-24 DIAGNOSIS — E785 Hyperlipidemia, unspecified: Secondary | ICD-10-CM | POA: Diagnosis present

## 2019-10-24 DIAGNOSIS — Z6837 Body mass index (BMI) 37.0-37.9, adult: Secondary | ICD-10-CM | POA: Diagnosis not present

## 2019-10-24 DIAGNOSIS — I10 Essential (primary) hypertension: Secondary | ICD-10-CM

## 2019-10-24 DIAGNOSIS — Z833 Family history of diabetes mellitus: Secondary | ICD-10-CM | POA: Diagnosis not present

## 2019-10-24 DIAGNOSIS — G4733 Obstructive sleep apnea (adult) (pediatric): Secondary | ICD-10-CM | POA: Diagnosis present

## 2019-10-24 DIAGNOSIS — Z6841 Body Mass Index (BMI) 40.0 and over, adult: Secondary | ICD-10-CM | POA: Diagnosis not present

## 2019-10-24 DIAGNOSIS — I5031 Acute diastolic (congestive) heart failure: Secondary | ICD-10-CM

## 2019-10-24 DIAGNOSIS — M810 Age-related osteoporosis without current pathological fracture: Secondary | ICD-10-CM | POA: Diagnosis present

## 2019-10-24 DIAGNOSIS — N183 Chronic kidney disease, stage 3 unspecified: Secondary | ICD-10-CM

## 2019-10-24 DIAGNOSIS — Z885 Allergy status to narcotic agent status: Secondary | ICD-10-CM | POA: Diagnosis not present

## 2019-10-24 DIAGNOSIS — Z794 Long term (current) use of insulin: Secondary | ICD-10-CM | POA: Diagnosis not present

## 2019-10-24 DIAGNOSIS — E1122 Type 2 diabetes mellitus with diabetic chronic kidney disease: Secondary | ICD-10-CM | POA: Diagnosis present

## 2019-10-24 DIAGNOSIS — E7800 Pure hypercholesterolemia, unspecified: Secondary | ICD-10-CM | POA: Diagnosis present

## 2019-10-24 DIAGNOSIS — J441 Chronic obstructive pulmonary disease with (acute) exacerbation: Secondary | ICD-10-CM | POA: Diagnosis present

## 2019-10-24 LAB — CBC
HCT: 42.1 % (ref 36.0–46.0)
Hemoglobin: 12.5 g/dL (ref 12.0–15.0)
MCH: 29.7 pg (ref 26.0–34.0)
MCHC: 29.7 g/dL — ABNORMAL LOW (ref 30.0–36.0)
MCV: 100 fL (ref 80.0–100.0)
Platelets: 201 10*3/uL (ref 150–400)
RBC: 4.21 MIL/uL (ref 3.87–5.11)
RDW: 13.8 % (ref 11.5–15.5)
WBC: 6.1 10*3/uL (ref 4.0–10.5)
nRBC: 0 % (ref 0.0–0.2)

## 2019-10-24 LAB — BASIC METABOLIC PANEL
Anion gap: 11 (ref 5–15)
BUN: 31 mg/dL — ABNORMAL HIGH (ref 8–23)
CO2: 33 mmol/L — ABNORMAL HIGH (ref 22–32)
Calcium: 8.5 mg/dL — ABNORMAL LOW (ref 8.9–10.3)
Chloride: 99 mmol/L (ref 98–111)
Creatinine, Ser: 1.46 mg/dL — ABNORMAL HIGH (ref 0.44–1.00)
GFR calc Af Amer: 37 mL/min — ABNORMAL LOW (ref >60)
GFR calc non Af Amer: 32 mL/min — ABNORMAL LOW (ref >60)
Glucose, Bld: 114 mg/dL — ABNORMAL HIGH (ref 70–99)
Potassium: 3.9 mmol/L (ref 3.5–5.1)
Sodium: 143 mmol/L (ref 135–145)

## 2019-10-24 LAB — GLUCOSE, CAPILLARY
Glucose-Capillary: 95 mg/dL (ref 70–99)
Glucose-Capillary: 264 mg/dL — ABNORMAL HIGH (ref 70–99)
Glucose-Capillary: 130 mg/dL — ABNORMAL HIGH (ref 70–99)
Glucose-Capillary: 115 mg/dL — ABNORMAL HIGH (ref 70–99)

## 2019-10-24 LAB — PROTIME-INR
INR: 2.4 — ABNORMAL HIGH (ref 0.8–1.2)
Prothrombin Time: 25.2 seconds — ABNORMAL HIGH (ref 11.4–15.2)

## 2019-10-24 LAB — HEMOGLOBIN A1C
Hgb A1c MFr Bld: 7.5 % — ABNORMAL HIGH (ref 4.8–5.6)
Mean Plasma Glucose: 168.55 mg/dL

## 2019-10-24 MED ORDER — WARFARIN SODIUM 2 MG PO TABS
4.0000 mg | ORAL_TABLET | Freq: Once | ORAL | Status: AC
  Administered 2019-10-24: 4 mg via ORAL
  Filled 2019-10-24: qty 2

## 2019-10-24 MED ORDER — CARVEDILOL 3.125 MG PO TABS
6.2500 mg | ORAL_TABLET | Freq: Two times a day (BID) | ORAL | Status: DC
  Administered 2019-10-24 – 2019-10-27 (×6): 6.25 mg via ORAL
  Filled 2019-10-24 (×7): qty 2

## 2019-10-24 NOTE — Progress Notes (Signed)
PROGRESS NOTE  Cynthia James:[id number] DOB: 12/14/1929 DOA: 10/23/2019 PCP: Abran Richard, MD  Brief History:  86 year old female with a history of chronic atrial fibrillation on warfarin, hypertension, hyperlipidemia, CKD stage III, diabetes mellitus type 2 presenting with 2 to 25-month history of shortness of breath that has worsened over the past week.  The patient has had increasing dyspnea on exertion.  She also describes orthopnea that has been worse over the past week.  She has chronic lower extremity edema, but states that this has been about the same as usual.  She denies any new medications.  She denies any fevers, chills, cough, hemoptysis, nausea, vomiting, diarrhea, abdominal pain.  She has not had any chest pain, palpitations, headache, dizziness, syncope.  She stated that she was recently started on prednisone and gabapentin for an orthopedic issue in the past month, but she only took 1 dose because it made her feel funny.  She has a 10-pack-year history of tobacco quitting smoking in 1996.  In the emergency department, the patient was afebrile hemodynamically stable with oxygen saturation down to 89% with ambulation on room air.  The patient was admitted for further evaluation of her worsening dyspnea.  Assessment/Plan: Acute respiratory failure with hypoxia -Primarily driven by CHF/pulmonary edema -Also multifactorial including COPD, OSA, morbid obesity -Initially placed on 2 L -Weaned to room air  Acute diastolic CHF -Continue IV furosemide 40 mg IV twice daily -Daily weights -Accurate I's and O's -Repeat echocardiogram -Continue carvedilol -Holding losartan to allow blood pressure margin for diuresis  Chronic atrial fibrillation -Rate controlled -CHADS-VASc = 5 -Continue warfarin -Has failed cardioversion and Tikosyn loading August 2018  CKD stage IIIb -Baseline creatinine 1.3-1.6 -Monitor with diuresis  COPD -Stable without  wheezing -Continue Dulera  Hyperlipidemia -Continue statin  Diabetes mellitus type 2 -Check hemoglobin A1c -NovoLog sliding scale  Obesity class II -BMI 37.50 -Lifestyle modification  OSA -Continue CPAP      Status is: Observation  The patient will require care spanning > 2 midnights and should be moved to inpatient because: IV treatments appropriate due to intensity of illness or inability to take PO remains clinically fluid overloaded. Requires IV lasix  Dispo: The patient is from: Home              Anticipated d/c is to: Home              Anticipated d/c date is: 2 days              Patient currently is not medically stable to d/c.        Family Communication:   No Family at bedside  Consultants:  none  Code Status:  FULL   DVT Prophylaxis: wafarin   Procedures: As Listed in Progress Note Above  Antibiotics: None       Subjective: Patient remains short of breath with minimal exertion.  She has orthopnea.  She denies any nausea, vomiting, diarrhea, abdominal pain, chest pain.  There is no fevers or chills.  There is no coughing or hemoptysis.  Objective: Vitals:   10/23/19 2205 10/24/19 0310 10/24/19 0357 10/24/19 0612  BP:  (!) 99/54  (!) 122/49  Pulse:  61  71  Resp:  20  20  Temp:  98 F (36.7 C)  98 F (36.7 C)  TempSrc:  Oral  Oral  SpO2: 98% 100%  100%  Weight:   87.1 kg   Height:  Intake/Output Summary (Last 24 hours) at 10/24/2019 0719 Last data filed at 10/24/2019 0536 Gross per 24 hour  Intake --  Output 1200 ml  Net -1200 ml   Weight change:  Exam:   General:  Pt is alert, follows commands appropriately, not in acute distress  HEENT: No icterus, No thrush, No neck mass, [place]/AT  Cardiovascular: IRRR, S1/S2, no rubs, no gallops +JVD  Respiratory: Bibasilar crackles.  No wheezing.  Good air movement  Abdomen: Soft/+BS, non tender, non distended, no guarding  Extremities: 2+LE  edema, No lymphangitis, No  petechiae, No rashes, no synovitis   Data Reviewed: I have personally reviewed following labs and imaging studies Basic Metabolic Panel: Recent Labs  Lab 10/23/19 1149 10/24/19 0426  NA 142 143  K 4.6 3.9  CL 100 99  CO2 33* 33*  GLUCOSE 118* 114*  BUN 29* 31*  CREATININE 1.54* 1.46*  CALCIUM 9.0 8.5*   Liver Function Tests: Recent Labs  Lab 10/23/19 1149  AST 25  ALT 17  ALKPHOS 63  BILITOT 0.5  PROT 6.6  ALBUMIN 3.7   No results for input(s): LIPASE, AMYLASE in the last 168 hours. No results for input(s): AMMONIA in the last 168 hours. Coagulation Profile: Recent Labs  Lab 10/23/19 1155 10/24/19 0426  INR 2.3* 2.4*   CBC: Recent Labs  Lab 10/23/19 1149 10/24/19 0426  WBC 5.7 6.1  NEUTROABS 3.3  --   HGB 13.1 12.5  HCT 43.6 42.1  MCV 98.6 100.0  PLT 201 201   Cardiac Enzymes: No results for input(s): CKTOTAL, CKMB, CKMBINDEX, TROPONINI in the last 168 hours. BNP: Invalid input(s): POCBNP CBG: Recent Labs  Lab 10/23/19 2202 10/24/19 0716  GLUCAP 190* 95   HbA1C: No results for input(s): HGBA1C in the last 72 hours. Urine analysis:    Component Value Date/Time   COLORURINE YELLOW 12/09/2014 2345   APPEARANCEUR HAZY (A) 12/09/2014 2345   LABSPEC 1.010 12/09/2014 2345   PHURINE 7.5 12/09/2014 2345   GLUCOSEU 100 (A) 12/09/2014 2345   HGBUR NEGATIVE 12/09/2014 2345   BILIRUBINUR NEGATIVE 12/09/2014 2345   KETONESUR NEGATIVE 12/09/2014 2345   PROTEINUR NEGATIVE 12/09/2014 2345   UROBILINOGEN 0.2 12/09/2014 2345   NITRITE NEGATIVE 12/09/2014 2345   LEUKOCYTESUR LARGE (A) 12/09/2014 2345   Sepsis Labs: @LABRCNTIP (procalcitonin:4,lacticidven:4) ) Recent Results (from the past 240 hour(s))  SARS Coronavirus 2 by RT PCR (hospital order, performed in [place] hospital lab) Nasopharyngeal Nasopharyngeal Swab     Status: None   Collection Time: 10/23/19  1:42 PM   Specimen: Nasopharyngeal Swab  Result Value Ref Range Status   SARS  Coronavirus 2 NEGATIVE NEGATIVE Final    Comment: (NOTE) SARS-CoV-2 target nucleic acids are NOT DETECTED. The SARS-CoV-2 RNA is generally detectable in upper and lower respiratory specimens during the acute phase of infection. The lowest concentration of SARS-CoV-2 viral copies this assay can detect is 250 copies / mL. A negative result does not preclude SARS-CoV-2 infection and should not be used as the sole basis for treatment or other patient management decisions.  A negative result may occur with improper specimen collection / handling, submission of specimen other than nasopharyngeal swab, presence of viral mutation(s) within the areas targeted by this assay, and inadequate number of viral copies (<250 copies / mL). A negative result must be combined with clinical observations, patient history, and epidemiological information. Fact Sheet for Patients:   StrictlyIdeas.no Fact Sheet for Healthcare Providers: BankingDealers.co.za This test is not yet approved or  cleared  by the [place] and has been authorized for detection and/or diagnosis of SARS-CoV-2 by FDA under an Emergency Use Authorization (EUA).  This EUA will remain in effect (meaning this test can be used) for the duration of the COVID-19 declaration under Section 564(b)(1) of the Act, 21 U.S.C. section 360bbb-3(b)(1), unless the authorization is terminated or revoked sooner. Performed at [facility], [address] [place], [place] [address]      Scheduled Meds: . carvedilol  6.25 mg Oral BID WC  . fluorometholone  1 drop Both Eyes q morning - 10a  . furosemide  40 mg Intravenous Q12H  . insulin aspart  0-5 Units Subcutaneous QHS  . insulin aspart  0-9 Units Subcutaneous TID WC  . insulin glargine  10 Units Subcutaneous q morning - 10a  . latanoprost  1 drop Both Eyes QHS  . mometasone-formoterol  2 puff Inhalation BID  . pravastatin  20 mg Oral QHS  .  Warfarin - Pharmacist Dosing Inpatient   Does not apply q1600   Continuous Infusions:  Procedures/Studies: DG Chest Port 1 View  Result Date: 10/23/2019 CLINICAL DATA:  Shortness of breath. EXAM: PORTABLE CHEST 1 VIEW COMPARISON:  06/30/2015 chest radiograph. FINDINGS: Left basilar scarring, unchanged. No new focal airspace opacity. No pneumothorax or pleural effusion. Mild central pulmonary vascular congestion. Aortic atherosclerotic calcifications. Cardiomediastinal silhouette is otherwise unremarkable. No acute osseous abnormality. IMPRESSION: Mild central pulmonary vascular congestion without frank edema. Left basilar scarring, unchanged. Electronically Signed   By: Primitivo Gauze M.D.   On: 10/23/2019 12:49    Orson Eva, DO  [facility]  If 7PM-7AM, please contact night-coverage [email or web address] Password [email or web address] 10/24/2019, 7:19 AM   LOS: 0 days

## 2019-10-24 NOTE — Plan of Care (Signed)

## 2019-10-24 NOTE — Progress Notes (Addendum)
ANTICOAGULATION CONSULT NOTE - Initial Consult  Pharmacy Consult for warfarin Indication: atrial fibrillation  Allergies  Allergen Reactions  . Codeine Nausea And Vomiting  . Percocet [Oxycodone-Acetaminophen] Nausea And Vomiting  . Tramadol Nausea And Vomiting  . Vicodin [Hydrocodone-Acetaminophen] Nausea And Vomiting    Patient Measurements: Height: 5' (152.4 cm) Weight: 87.1 kg (192 lb 0.3 oz) IBW/kg (Calculated) : 45.5  Vital Signs: Temp: 98 F (36.7 C) (06/03 0612) Temp Source: Oral (06/03 0612) BP: 166/89 (06/03 0900) Pulse Rate: 84 (06/03 0900)  Labs: Recent Labs    10/23/19 1149 10/23/19 1155 10/23/19 1343 10/24/19 0426  HGB 13.1  --   --  12.5  HCT 43.6  --   --  42.1  PLT 201  --   --  201  LABPROT  --  24.4*  --  25.2*  INR  --  2.3*  --  2.4*  CREATININE 1.54*  --   --  1.46*  TROPONINIHS 9  --  10  --     Estimated Creatinine Clearance: 26.6 mL/min (A) (by C-G formula based on SCr of 1.46 mg/dL (H)).  Assessment: 86 yo W on warfarin PTA for afib (CHADS2VASc = 5). Last dose 6/1. Admit INR was therapeutic at 2.3. Last outpatient INR was 4.5 on 09/23/19. H/H and platelets stable.   PTA warfarin dose = 4mg  daily   Goal of Therapy:  INR 2-3 Monitor platelets by anticoagulation protocol: Yes   Plan:  Repeat warfarin 4mg  x1 today (home dose) Daily INR and  CBC Monitor for signs/symptoms of bleeding    Despina Pole, Pharm. D. Clinical Pharmacist 10/24/2019 10:57 AM

## 2019-10-24 NOTE — Progress Notes (Signed)
*  PRELIMINARY RESULTS* Echocardiogram 2D Echocardiogram has been performed.  Leavy Cella 10/24/2019, 10:45 AM

## 2019-10-25 LAB — GLUCOSE, CAPILLARY
Glucose-Capillary: 99 mg/dL (ref 70–99)
Glucose-Capillary: 256 mg/dL — ABNORMAL HIGH (ref 70–99)
Glucose-Capillary: 58 mg/dL — ABNORMAL LOW (ref 70–99)
Glucose-Capillary: 152 mg/dL — ABNORMAL HIGH (ref 70–99)
Glucose-Capillary: 142 mg/dL — ABNORMAL HIGH (ref 70–99)

## 2019-10-25 LAB — BASIC METABOLIC PANEL
Anion gap: 13 (ref 5–15)
BUN: 39 mg/dL — ABNORMAL HIGH (ref 8–23)
CO2: 36 mmol/L — ABNORMAL HIGH (ref 22–32)
Calcium: 8.7 mg/dL — ABNORMAL LOW (ref 8.9–10.3)
Chloride: 94 mmol/L — ABNORMAL LOW (ref 98–111)
Creatinine, Ser: 1.65 mg/dL — ABNORMAL HIGH (ref 0.44–1.00)
GFR calc Af Amer: 32 mL/min — ABNORMAL LOW (ref >60)
GFR calc non Af Amer: 28 mL/min — ABNORMAL LOW (ref >60)
Glucose, Bld: 129 mg/dL — ABNORMAL HIGH (ref 70–99)
Potassium: 4 mmol/L (ref 3.5–5.1)
Sodium: 143 mmol/L (ref 135–145)

## 2019-10-25 LAB — PROTIME-INR
INR: 2.8 — ABNORMAL HIGH (ref 0.8–1.2)
Prothrombin Time: 28.7 seconds — ABNORMAL HIGH (ref 11.4–15.2)

## 2019-10-25 LAB — CBC
HCT: 45 % (ref 36.0–46.0)
Hemoglobin: 13.5 g/dL (ref 12.0–15.0)
MCH: 29.7 pg (ref 26.0–34.0)
MCHC: 30 g/dL (ref 30.0–36.0)
MCV: 99.1 fL (ref 80.0–100.0)
Platelets: 208 10*3/uL (ref 150–400)
RBC: 4.54 MIL/uL (ref 3.87–5.11)
RDW: 13.5 % (ref 11.5–15.5)
WBC: 7.8 10*3/uL (ref 4.0–10.5)
nRBC: 0 % (ref 0.0–0.2)

## 2019-10-25 LAB — ECHOCARDIOGRAM COMPLETE
Height: 60 in
Weight: 3072.33 oz

## 2019-10-25 LAB — MAGNESIUM: Magnesium: 2 mg/dL (ref 1.7–2.4)

## 2019-10-25 MED ORDER — WARFARIN SODIUM 2 MG PO TABS
2.0000 mg | ORAL_TABLET | Freq: Once | ORAL | Status: AC
  Administered 2019-10-25: 2 mg via ORAL
  Filled 2019-10-25: qty 1

## 2019-10-25 NOTE — Plan of Care (Signed)

## 2019-10-25 NOTE — Progress Notes (Signed)
PROGRESS NOTE  Cynthia James VOZ:[id number] DOB: 1929-07-25 DOA: 10/23/2019 PCP: Abran Richard, MD    Brief History:  86 year old female with a history of chronic atrial fibrillation on warfarin, hypertension, hyperlipidemia, CKD stage III, diabetes mellitus type 2 presenting with 2 to 46-month history of shortness of breath that has worsened over the past week.  The patient has had increasing dyspnea on exertion.  She also describes orthopnea that has been worse over the past week.  She has chronic lower extremity edema, but states that this has been about the same as usual.  She denies any new medications.  She denies any fevers, chills, cough, hemoptysis, nausea, vomiting, diarrhea, abdominal pain.  She has not had any chest pain, palpitations, headache, dizziness, syncope.  She stated that she was recently started on prednisone and gabapentin for an orthopedic issue in the past month, but she only took 1 dose because it made her feel funny.  She has a 10-pack-year history of tobacco quitting smoking in 1996.  In the emergency department, the patient was afebrile hemodynamically stable with oxygen saturation down to 89% with ambulation on room air.  The patient was admitted for further evaluation of her worsening dyspnea.  Assessment/Plan: Acute respiratory failure with hypoxia -Primarily driven by CHF/pulmonary edema -Also multifactorial including COPD, OSA, morbid obesity -Initially placed on 2 L -Weaned to room air  Acute diastolic CHF -Continue IV furosemide 40 mg IV twice daily -Daily weights -Accurate I's and O's -Repeat echocardiogram--EF 70-75%, indeterminant diastolic function, mild AS, trivial MR -Continue carvedilol -Holding losartan to allow blood pressure margin for diuresis  Chronic atrial fibrillation -Rate controlled -CHADS-VASc = 5 -Continue warfarin -Has failed cardioversion and Tikosyn loading August 2018  CKD stage IIIb -Baseline creatinine  1.3-1.6 -Monitor with diuresis  COPD -Stable without wheezing -Continue Dulera  Hyperlipidemia -Continue statin  Diabetes mellitus type 2 -Check hemoglobin A1c--7.5 -NovoLog sliding scale  Obesity class II -BMI 37.50 -Lifestyle modification  OSA -Continue CPAP      Status is: Observation  The patient will require care spanning > 2 midnights and should be moved to inpatient because: IV treatments appropriate due to intensity of illness or inability to take PO remains clinically fluid overloaded. Requires IV lasix  Dispo: The patient is from: Home  Anticipated d/c is to: Home  Anticipated d/c date is: 2 days  Patient currently is not medically stable to d/c.        Family Communication:   significant other at bedside 6/4  Consultants:  none  Code Status:  FULL   DVT Prophylaxis: wafarin   Procedures: As Listed in Progress Note Above  Antibiotics: None      Subjective: Pt feeling better, but still has some orthopnea and dyspnea on exertion.  Denies f/c, cp, n/v/d, abd pain.  Objective: Vitals:   10/25/19 0435 10/25/19 0455 10/25/19 0759 10/25/19 1440  BP:  115/74  (!) 108/43  Pulse:  70  67  Resp:  18  20  Temp:  97.6 F (36.4 C)  98.2 F (36.8 C)  TempSrc:    Oral  SpO2:  98% 96% 100%  Weight: 87.7 kg     Height:        Intake/Output Summary (Last 24 hours) at 10/25/2019 1655 Last data filed at 10/25/2019 0600 Gross per 24 hour  Intake 0 ml  Output 1100 ml  Net -1100 ml   Weight change: -7.102 kg Exam:   General:  Pt is alert, follows commands appropriately, not in acute distress  HEENT: No icterus, No thrush, No neck mass, [place]/AT  Cardiovascular: RRR, S1/S2, no rubs, no gallops  Respiratory: bibasilar crackles. No wheeze  Abdomen: Soft/+BS, non tender, non distended, no guarding  Extremities: 2+LE edema, No lymphangitis, No petechiae, No rashes, no  synovitis   Data Reviewed: I have personally reviewed following labs and imaging studies Basic Metabolic Panel: Recent Labs  Lab 10/23/19 1149 10/24/19 0426 10/25/19 0609  NA 142 143 143  K 4.6 3.9 4.0  CL 100 99 94*  CO2 33* 33* 36*  GLUCOSE 118* 114* 129*  BUN 29* 31* 39*  CREATININE 1.54* 1.46* 1.65*  CALCIUM 9.0 8.5* 8.7*  MG  --   --  2.0   Liver Function Tests: Recent Labs  Lab 10/23/19 1149  AST 25  ALT 17  ALKPHOS 63  BILITOT 0.5  PROT 6.6  ALBUMIN 3.7   No results for input(s): LIPASE, AMYLASE in the last 168 hours. No results for input(s): AMMONIA in the last 168 hours. Coagulation Profile: Recent Labs  Lab 10/23/19 1155 10/24/19 0426 10/25/19 0609  INR 2.3* 2.4* 2.8*   CBC: Recent Labs  Lab 10/23/19 1149 10/24/19 0426 10/25/19 0609  WBC 5.7 6.1 7.8  NEUTROABS 3.3  --   --   HGB 13.1 12.5 13.5  HCT 43.6 42.1 45.0  MCV 98.6 100.0 99.1  PLT 201 201 208   Cardiac Enzymes: No results for input(s): CKTOTAL, CKMB, CKMBINDEX, TROPONINI in the last 168 hours. BNP: Invalid input(s): POCBNP CBG: Recent Labs  Lab 10/24/19 1614 10/24/19 2159 10/25/19 0752 10/25/19 1140 10/25/19 1649  GLUCAP 130* 115* 99 256* 58*   HbA1C: Recent Labs    10/23/19 1343  HGBA1C 7.5*   Urine analysis:    Component Value Date/Time   COLORURINE YELLOW 12/09/2014 2345   APPEARANCEUR HAZY (A) 12/09/2014 2345   LABSPEC 1.010 12/09/2014 2345   PHURINE 7.5 12/09/2014 2345   GLUCOSEU 100 (A) 12/09/2014 2345   HGBUR NEGATIVE 12/09/2014 2345   BILIRUBINUR NEGATIVE 12/09/2014 2345   KETONESUR NEGATIVE 12/09/2014 2345   PROTEINUR NEGATIVE 12/09/2014 2345   UROBILINOGEN 0.2 12/09/2014 2345   NITRITE NEGATIVE 12/09/2014 2345   LEUKOCYTESUR LARGE (A) 12/09/2014 2345   Sepsis Labs: @LABRCNTIP (procalcitonin:4,lacticidven:4) ) Recent Results (from the past 240 hour(s))  SARS Coronavirus 2 by RT PCR (hospital order, performed in [place] hospital lab)  Nasopharyngeal Nasopharyngeal Swab     Status: None   Collection Time: 10/23/19  1:42 PM   Specimen: Nasopharyngeal Swab  Result Value Ref Range Status   SARS Coronavirus 2 NEGATIVE NEGATIVE Final    Comment: (NOTE) SARS-CoV-2 target nucleic acids are NOT DETECTED. The SARS-CoV-2 RNA is generally detectable in upper and lower respiratory specimens during the acute phase of infection. The lowest concentration of SARS-CoV-2 viral copies this assay can detect is 250 copies / mL. A negative result does not preclude SARS-CoV-2 infection and should not be used as the sole basis for treatment or other patient management decisions.  A negative result may occur with improper specimen collection / handling, submission of specimen other than nasopharyngeal swab, presence of viral mutation(s) within the areas targeted by this assay, and inadequate number of viral copies (<250 copies / mL). A negative result must be combined with clinical observations, patient history, and epidemiological information. Fact Sheet for Patients:   StrictlyIdeas.no Fact Sheet for Healthcare Providers: BankingDealers.co.za This test is not yet approved or cleared  by  the [employer] and has been authorized for detection and/or diagnosis of SARS-CoV-2 by FDA under an Emergency Use Authorization (EUA).  This EUA will remain in effect (meaning this test can be used) for the duration of the COVID-19 declaration under Section 564(b)(1) of the Act, 21 U.S.C. section 360bbb-3(b)(1), unless the authorization is terminated or revoked sooner. Performed at [facility], [address] [place], [place] [address]      Scheduled Meds: . carvedilol  6.25 mg Oral BID WC  . fluorometholone  1 drop Both Eyes q morning - 10a  . furosemide  40 mg Intravenous Q12H  . insulin aspart  0-5 Units Subcutaneous QHS  . insulin aspart  0-9 Units Subcutaneous TID WC  . insulin glargine  10  Units Subcutaneous q morning - 10a  . latanoprost  1 drop Both Eyes QHS  . mometasone-formoterol  2 puff Inhalation BID  . pravastatin  20 mg Oral QHS  . warfarin  2 mg Oral ONCE-1600  . Warfarin - Pharmacist Dosing Inpatient   Does not apply q1600   Continuous Infusions:  Procedures/Studies: DG Chest Port 1 View  Result Date: 10/23/2019 CLINICAL DATA:  Shortness of breath. EXAM: PORTABLE CHEST 1 VIEW COMPARISON:  06/30/2015 chest radiograph. FINDINGS: Left basilar scarring, unchanged. No new focal airspace opacity. No pneumothorax or pleural effusion. Mild central pulmonary vascular congestion. Aortic atherosclerotic calcifications. Cardiomediastinal silhouette is otherwise unremarkable. No acute osseous abnormality. IMPRESSION: Mild central pulmonary vascular congestion without frank edema. Left basilar scarring, unchanged. Electronically Signed   By: Primitivo Gauze M.D.   On: 10/23/2019 12:49   ECHOCARDIOGRAM COMPLETE  Result Date: 10/25/2019    ECHOCARDIOGRAM REPORT   Patient Name:   Cynthia James Date of Exam: 10/24/2019 Medical Rec #:  [id number]            Height:       60.0 in Accession #:    [id number]           Weight:       192.0 lb Date of Birth:  06/02/1929            BSA:          1.835 m Patient Age:    76 years             BP:           166/89 mmHg Patient Gender: F                    HR:           84 bpm. Exam Location:  [facility] Procedure: 2D Echo Indications:    Dyspnea 786.09 / R06.00  History:        Patient has no prior history of Echocardiogram examinations.                 COPD, Arrythmias:Atrial Fibrillation, Signs/Symptoms:Dyspnea;                 Risk Factors:Hypertension, Diabetes, Dyslipidemia and Former                 Smoker.  Sonographer:    Leavy Cella RDCS (AE) Referring Phys: [place]  1. Left ventricular ejection fraction, by estimation, is 70 to 75%. The left ventricle has hyperdynamic function. The left ventricle has no  regional wall motion abnormalities. There is moderate left ventricular hypertrophy. Left ventricular diastolic parameters are indeterminate.  2. Right ventricular systolic function is  normal. The right ventricular size is normal. There is mildly elevated pulmonary artery systolic pressure.  3. The mitral valve is abnormal. Trivial mitral valve regurgitation.  4. AV is thickened, calcified Significant calcification on noncoronary cusp. Peak and mean gradients through the valve are 14 and 7 mm Hg consistent with mild AS. Would recomm follow up echo in 6 to 12 months. The aortic valve is tricuspid. Aortic valve  regurgitation is not visualized.  5. The inferior vena cava is normal in size with <50% respiratory variability, suggesting right atrial pressure of 8 mmHg. FINDINGS  Left Ventricle: Left ventricular ejection fraction, by estimation, is 70 to 75%. The left ventricle has hyperdynamic function. The left ventricle has no regional wall motion abnormalities. The left ventricular internal cavity size was normal in size. There is moderate left ventricular hypertrophy. Left ventricular diastolic parameters are indeterminate. Right Ventricle: The right ventricular size is normal. Right vetricular wall thickness was not assessed. Right ventricular systolic function is normal. There is mildly elevated pulmonary artery systolic pressure. The tricuspid regurgitant velocity is 2.80 m/s, and with an assumed right atrial pressure of 10 mmHg, the estimated right ventricular systolic pressure is 23.5 mmHg. Left Atrium: Left atrial size was normal in size. Right Atrium: Right atrial size was normal in size. Pericardium: There is no evidence of pericardial effusion. Mitral Valve: The mitral valve is abnormal. There is mild thickening of the mitral valve leaflet(s). Mild mitral annular calcification. Trivial mitral valve regurgitation. Tricuspid Valve: The tricuspid valve is normal in structure. Tricuspid valve regurgitation is  mild. Aortic Valve: AV is thickened, calcified Significant calcification on noncoronary cusp. Peak and mean gradients through the valve are 14 and 7 mm Hg consistent with mild AS. Would recomm follow up echo in 6 to 12 months. The aortic valve is tricuspid. Aortic valve regurgitation is not visualized. Pulmonic Valve: The pulmonic valve was not well visualized. Pulmonic valve regurgitation is not visualized. Aorta: The aortic root is normal in size and structure. Venous: The inferior vena cava is normal in size with less than 50% respiratory variability, suggesting right atrial pressure of 8 mmHg. IAS/Shunts: No atrial level shunt detected by color flow Doppler.  LEFT VENTRICLE PLAX 2D LVIDd:         3.31 cm  Diastology LVIDs:         1.93 cm  LV e' lateral:   9.68 cm/s LV PW:         1.62 cm  LV E/e' lateral: 9.7 LV IVS:        1.36 cm  LV e' medial:    7.18 cm/s LVOT diam:     1.90 cm  LV E/e' medial:  13.0 LVOT Area:     2.84 cm  RIGHT VENTRICLE RV S prime:     9.46 cm/s TAPSE (M-mode): 1.6 cm LEFT ATRIUM             Index       RIGHT ATRIUM           Index LA diam:        3.40 cm 1.85 cm/m  RA Area:     13.10 cm LA Vol (A2C):   46.3 ml 25.24 ml/m RA Volume:   33.60 ml  18.31 ml/m LA Vol (A4C):   59.4 ml 32.38 ml/m LA Biplane Vol: 52.8 ml 28.78 ml/m   AORTA Ao Root diam: 2.90 cm MITRAL VALVE  TRICUSPID VALVE MV Area (PHT): 3.17 cm    TR Peak grad:   31.4 mmHg MV Decel Time: 239 msec    TR Vmax:        280.00 cm/s MV E velocity: 93.60 cm/s MV A velocity: 18.90 cm/s  SHUNTS MV E/A ratio:  4.95        Systemic Diam: 1.90 cm Dorris Carnes MD Electronically signed by Dorris Carnes MD Signature Date/Time: 10/25/2019/3:15:13 PM    Final     Orson Eva, DO  [facility]  If 7PM-7AM, please contact night-coverage [email or web address] Password [email or web address] 10/25/2019, 4:55 PM   LOS: 1 day

## 2019-10-25 NOTE — Progress Notes (Signed)
Hypoglycemic Event  CBG: 58 at 1649  Treatment: 8 oz juice/soda  Symptoms: None  Follow-up CBG: Time:1819 CBG Result:152  Possible Reasons for Event: Unknown  Comments/MD notified:Notified Dr. Sibyl Parr

## 2019-10-25 NOTE — Care Management Important Message (Signed)
Important Message  Patient Details  Name: Cynthia James MRN: [id number] Date of Birth: 1929/11/25   Medicare Important Message Given:  Yes     Tommy Medal 10/25/2019, 1:03 PM

## 2019-10-25 NOTE — Progress Notes (Signed)
ANTICOAGULATION CONSULT NOTE - Initial Consult  Pharmacy Consult for warfarin Indication: atrial fibrillation  Allergies  Allergen Reactions  . Codeine Nausea And Vomiting  . Percocet [Oxycodone-Acetaminophen] Nausea And Vomiting  . Tramadol Nausea And Vomiting  . Vicodin [Hydrocodone-Acetaminophen] Nausea And Vomiting    Patient Measurements: Height: 5' (152.4 cm) Weight: 87.7 kg (193 lb 5.5 oz) IBW/kg (Calculated) : 45.5  Vital Signs: Temp: 97.6 F (36.4 C) (06/04 0455) BP: 115/74 (06/04 0455) Pulse Rate: 70 (06/04 0455)  Labs: Recent Labs    10/23/19 1149 10/23/19 1149 10/23/19 1155 10/23/19 1343 10/24/19 0426 10/25/19 0609  HGB 13.1   < >  --   --  12.5 13.5  HCT 43.6  --   --   --  42.1 45.0  PLT 201  --   --   --  201 208  LABPROT  --   --  24.4*  --  25.2* 28.7*  INR  --   --  2.3*  --  2.4* 2.8*  CREATININE 1.54*  --   --   --  1.46* 1.65*  TROPONINIHS 9  --   --  10  --   --    < > = values in this interval not displayed.    Estimated Creatinine Clearance: 23.7 mL/min (A) (by C-G formula based on SCr of 1.65 mg/dL (H)).  Assessment: 86 yo W on warfarin PTA for afib (CHADS2VASc = 5). Last dose 6/1. Admit INR was therapeutic at 2.3. Last outpatient INR was 4.5 on 09/23/19. H/H and platelets stable.   PTA warfarin dose = 4mg  daily   Goal of Therapy:  INR 2-3 Monitor platelets by anticoagulation protocol: Yes   6/4 update: INR: 2.8  --> INR bumped up by 0.4 in one day, so will back off dose CBC: remains WNL   Plan:  Give warfarin 2mg  x1 today (50% of home dose) Daily INR and  CBC Monitor for signs/symptoms of bleeding    Despina Pole, Pharm. D. Clinical Pharmacist 10/25/2019 10:49 AM

## 2019-10-26 LAB — BASIC METABOLIC PANEL
Anion gap: 11 (ref 5–15)
BUN: 40 mg/dL — ABNORMAL HIGH (ref 8–23)
CO2: 38 mmol/L — ABNORMAL HIGH (ref 22–32)
Calcium: 8.3 mg/dL — ABNORMAL LOW (ref 8.9–10.3)
Chloride: 93 mmol/L — ABNORMAL LOW (ref 98–111)
Creatinine, Ser: 1.63 mg/dL — ABNORMAL HIGH (ref 0.44–1.00)
GFR calc Af Amer: 32 mL/min — ABNORMAL LOW (ref >60)
GFR calc non Af Amer: 28 mL/min — ABNORMAL LOW (ref >60)
Glucose, Bld: 127 mg/dL — ABNORMAL HIGH (ref 70–99)
Potassium: 3.4 mmol/L — ABNORMAL LOW (ref 3.5–5.1)
Sodium: 142 mmol/L (ref 135–145)

## 2019-10-26 LAB — GLUCOSE, CAPILLARY
Glucose-Capillary: 115 mg/dL — ABNORMAL HIGH (ref 70–99)
Glucose-Capillary: 185 mg/dL — ABNORMAL HIGH (ref 70–99)
Glucose-Capillary: 195 mg/dL — ABNORMAL HIGH (ref 70–99)
Glucose-Capillary: 187 mg/dL — ABNORMAL HIGH (ref 70–99)

## 2019-10-26 LAB — CBC
HCT: 40.9 % (ref 36.0–46.0)
Hemoglobin: 12.5 g/dL (ref 12.0–15.0)
MCH: 29.6 pg (ref 26.0–34.0)
MCHC: 30.6 g/dL (ref 30.0–36.0)
MCV: 96.9 fL (ref 80.0–100.0)
Platelets: 179 10*3/uL (ref 150–400)
RBC: 4.22 MIL/uL (ref 3.87–5.11)
RDW: 13.5 % (ref 11.5–15.5)
WBC: 8.7 10*3/uL (ref 4.0–10.5)
nRBC: 0 % (ref 0.0–0.2)

## 2019-10-26 LAB — PROTIME-INR
INR: 2.9 — ABNORMAL HIGH (ref 0.8–1.2)
Prothrombin Time: 29.7 seconds — ABNORMAL HIGH (ref 11.4–15.2)

## 2019-10-26 LAB — MAGNESIUM: Magnesium: 1.9 mg/dL (ref 1.7–2.4)

## 2019-10-26 MED ORDER — POTASSIUM CHLORIDE CRYS ER 20 MEQ PO TBCR
20.0000 meq | EXTENDED_RELEASE_TABLET | Freq: Every day | ORAL | Status: DC
  Administered 2019-10-26 – 2019-10-27 (×2): 20 meq via ORAL
  Filled 2019-10-26 (×2): qty 1

## 2019-10-26 MED ORDER — WARFARIN SODIUM 1 MG PO TABS
1.0000 mg | ORAL_TABLET | Freq: Once | ORAL | Status: AC
  Administered 2019-10-26: 1 mg via ORAL
  Filled 2019-10-26: qty 1

## 2019-10-26 NOTE — Progress Notes (Signed)
ANTICOAGULATION CONSULT NOTE - Initial Consult  Pharmacy Consult for warfarin Indication: atrial fibrillation  Allergies  Allergen Reactions  . Codeine Nausea And Vomiting  . Percocet [Oxycodone-Acetaminophen] Nausea And Vomiting  . Tramadol Nausea And Vomiting  . Vicodin [Hydrocodone-Acetaminophen] Nausea And Vomiting    Patient Measurements: Height: 5' (152.4 cm) Weight: 84.2 kg (185 lb 10 oz) IBW/kg (Calculated) : 45.5  Vital Signs: Temp: 98.8 F (37.1 C) (06/05 0519) Temp Source: Oral (06/05 0519) BP: 127/56 (06/05 0519) Pulse Rate: 80 (06/05 0519)  Labs: Recent Labs    10/23/19 1149 10/23/19 1155 10/23/19 1343 10/24/19 0426 10/24/19 0426 10/25/19 0609 10/26/19 0548  HGB 13.1   < >  --  12.5   < > 13.5 12.5  HCT 43.6   < >  --  42.1  --  45.0 40.9  PLT 201   < >  --  201  --  208 179  LABPROT  --    < >  --  25.2*  --  28.7* 29.7*  INR  --    < >  --  2.4*  --  2.8* 2.9*  CREATININE 1.54*   < >  --  1.46*  --  1.65* 1.63*  TROPONINIHS 9  --  10  --   --   --   --    < > = values in this interval not displayed.    Estimated Creatinine Clearance: 23.4 mL/min (A) (by C-G formula based on SCr of 1.63 mg/dL (H)).  Assessment: 86 yo W on warfarin PTA for afib (CHADS2VASc = 5). Last dose 6/1. Admit INR was therapeutic at 2.3. Last outpatient INR was 4.5 on 09/23/19. H/H and platelets stable.   PTA warfarin dose = 4mg  daily   Goal of Therapy:  INR 2-3 Monitor platelets by anticoagulation protocol: Yes   6/4 update: INR: 2.9  --> INR bumped up by 0.4 in one day, so will back off dose CBC: remains WNL   Plan:  Give warfarin 1mg  x1 today (25% of home dose) Daily INR and  CBC Monitor for signs/symptoms of bleeding    Thomasenia Sales, PharmD, MBA, BCGP Clinical Pharmacist  10/26/2019 9:32 AM

## 2019-10-26 NOTE — Progress Notes (Signed)
PROGRESS NOTE  Cynthia James BPZ:[id number] DOB: [birth year] DOA: 10/23/2019 PCP: Abran Richard, MD  Brief History: 86 year old female with a history of chronic atrial fibrillation on warfarin, hypertension, hyperlipidemia, CKD stage III, diabetes mellitus type 2 presenting with 2 to 29-month history of shortness of breath that has worsened over the past week. The patient has had increasing dyspnea on exertion. She also describes orthopnea that has been worse over the past week. She has chronic lower extremity edema, but states that this has been about the same as usual. She denies any new medications. She denies any fevers, chills, cough, hemoptysis, nausea, vomiting, diarrhea, abdominal pain. She has not had any chest pain, palpitations, headache, dizziness, syncope. She stated that she was recently started on prednisone and gabapentin for an orthopedic issue in the past month, but she only took 1 dose because it made her feel funny. She has a 10-pack-year history of tobacco quitting smoking in 1996. In the emergency department, the patient was afebrile hemodynamically stable with oxygen saturation down to 89% with ambulation on room air. The patient was admitted for further evaluation of her worsening dyspnea.  Assessment/Plan: Acute respiratory failure with hypoxia -Primarily driven by CHF/pulmonary edema -Also multifactorial including COPD, OSA, morbid obesity -Initially placed on 2 L -Wean to room air  Acute diastolic CHF -remains fluid overloaded -Continue IV furosemide 40 mg IV twice daily -Daily weights -Accurate I's and O's--NEG3.6L -Repeat echocardiogram--EF 70-75%, indeterminant diastolic function, mild AS, trivial MR -Continue carvedilol -Holding losartan to allow blood pressure margin for diuresis  Chronic atrial fibrillation -Rate controlled -CHADS-VASc = 5 -Continue warfarin -Has failed cardioversion and Tikosyn loading August 2018  CKD  stage IIIb -Baseline creatinine 1.3-1.6 -Monitor with diuresis  COPD -Stable without wheezing -Continue Dulera  Hyperlipidemia -Continue statin  Diabetes mellitus type 2 -Check hemoglobin A1c--7.5 -NovoLog sliding scale  Obesity class II -BMI 37.50 -Lifestyle modification  OSA -Continue CPAP      Status is: inpatient  The patient will require care spanning > 2 midnights and should be moved to inpatient because:IV treatments appropriate due to intensity of illness or inability to take POremains clinically fluid overloaded. Requires IV lasix  Dispo: The patient is from:Home Anticipated d/c is EU:MPNT Anticipated d/c date is: 1 days Patient currentlyis not medically stable to d/c.        Family Communication:significant other at bedside 6/4  Consultants:none  Code Status: FULL   DVT Prophylaxis:wafarin   Procedures: As Listed in Progress Note Above  Antibiotics: None     Subjective: Sob is slowly improving but still having trouble lying flat.  Denies cp, n/v/d, f/c,, abd pain.  No hemoptysis  Objective: Vitals:   10/25/19 2352 10/26/19 0519 10/26/19 0817 10/26/19 1332  BP:  (!) 127/56  (!) 113/58  Pulse: 75 80  70  Resp: 16 20  19   Temp:  98.8 F (37.1 C)  99.2 F (37.3 C)  TempSrc:  Oral  Oral  SpO2: 98% 98% 97% 97%  Weight:  84.2 kg    Height:        Intake/Output Summary (Last 24 hours) at 10/26/2019 1640 Last data filed at 10/26/2019 1300 Gross per 24 hour  Intake 720 ml  Output 2300 ml  Net -1580 ml   Weight change: -3.5 kg Exam:   General:  Pt is alert, follows commands appropriately, not in acute distress  HEENT: No icterus, No thrush, No neck mass, Mono/AT  Cardiovascular: RRR,  S1/S2, no rubs, no gallops  Respiratory: bibasilar crackles. No wheeze  Abdomen: Soft/+BS, non tender, non distended, no guarding  Extremities: 1+LE edema, No  lymphangitis, No petechiae, No rashes, no synovitis   Data Reviewed: I have personally reviewed following labs and imaging studies Basic Metabolic Panel: Recent Labs  Lab 10/23/19 1149 10/24/19 0426 10/25/19 0609 10/26/19 0548  NA 142 143 143 142  K 4.6 3.9 4.0 3.4*  CL 100 99 94* 93*  CO2 33* 33* 36* 38*  GLUCOSE 118* 114* 129* 127*  BUN 29* 31* 39* 40*  CREATININE 1.54* 1.46* 1.65* 1.63*  CALCIUM 9.0 8.5* 8.7* 8.3*  MG  --   --  2.0 1.9   Liver Function Tests: Recent Labs  Lab 10/23/19 1149  AST 25  ALT 17  ALKPHOS 63  BILITOT 0.5  PROT 6.6  ALBUMIN 3.7   No results for input(s): LIPASE, AMYLASE in the last 168 hours. No results for input(s): AMMONIA in the last 168 hours. Coagulation Profile: Recent Labs  Lab 10/23/19 1155 10/24/19 0426 10/25/19 0609 10/26/19 0548  INR 2.3* 2.4* 2.8* 2.9*   CBC: Recent Labs  Lab 10/23/19 1149 10/24/19 0426 10/25/19 0609 10/26/19 0548  WBC 5.7 6.1 7.8 8.7  NEUTROABS 3.3  --   --   --   HGB 13.1 12.5 13.5 12.5  HCT 43.6 42.1 45.0 40.9  MCV 98.6 100.0 99.1 96.9  PLT 201 201 208 179   Cardiac Enzymes: No results for input(s): CKTOTAL, CKMB, CKMBINDEX, TROPONINI in the last 168 hours. BNP: Invalid input(s): POCBNP CBG: Recent Labs  Lab 10/25/19 1819 10/25/19 2123 10/26/19 0727 10/26/19 1110 10/26/19 1627  GLUCAP 152* 142* 115* 185* 195*   HbA1C: No results for input(s): HGBA1C in the last 72 hours. Urine analysis:    Component Value Date/Time   COLORURINE YELLOW 12/09/2014 2345   APPEARANCEUR HAZY (A) 12/09/2014 2345   LABSPEC 1.010 12/09/2014 2345   PHURINE 7.5 12/09/2014 2345   GLUCOSEU 100 (A) 12/09/2014 2345   HGBUR NEGATIVE 12/09/2014 2345   BILIRUBINUR NEGATIVE 12/09/2014 2345   KETONESUR NEGATIVE 12/09/2014 2345   PROTEINUR NEGATIVE 12/09/2014 2345   UROBILINOGEN 0.2 12/09/2014 2345   NITRITE NEGATIVE 12/09/2014 2345   LEUKOCYTESUR LARGE (A) 12/09/2014 2345   Sepsis  Labs: @LABRCNTIP (procalcitonin:4,lacticidven:4) ) Recent Results (from the past 240 hour(s))  SARS Coronavirus 2 by RT PCR (hospital order, performed in [facility] hospital lab) Nasopharyngeal Nasopharyngeal Swab     Status: None   Collection Time: 10/23/19  1:42 PM   Specimen: Nasopharyngeal Swab  Result Value Ref Range Status   SARS Coronavirus 2 NEGATIVE NEGATIVE Final    Comment: (NOTE) SARS-CoV-2 target nucleic acids are NOT DETECTED. The SARS-CoV-2 RNA is generally detectable in upper and lower respiratory specimens during the acute phase of infection. The lowest concentration of SARS-CoV-2 viral copies this assay can detect is 250 copies / mL. A negative result does not preclude SARS-CoV-2 infection and should not be used as the sole basis for treatment or other patient management decisions.  A negative result may occur with improper specimen collection / handling, submission of specimen other than nasopharyngeal swab, presence of viral mutation(s) within the areas targeted by this assay, and inadequate number of viral copies (<250 copies / mL). A negative result must be combined with clinical observations, patient history, and epidemiological information. Fact Sheet for Patients:   StrictlyIdeas.no Fact Sheet for Healthcare Providers: BankingDealers.co.za This test is not yet approved or cleared  by the  [place] Architectural technologist and has been authorized for detection and/or diagnosis of SARS-CoV-2 by FDA under an Print production planner (EUA).  This EUA will remain in effect (meaning this test can be used) for the duration of the COVID-19 declaration under Section 564(b)(1) of the Act, 21 U.S.C. section 360bbb-3(b)(1), unless the authorization is terminated or revoked sooner. Performed at [facility], [address] [place], [place] [address]      Scheduled Meds: . carvedilol  6.25 mg Oral BID WC  . fluorometholone  1 drop Both  Eyes q morning - 10a  . furosemide  40 mg Intravenous Q12H  . insulin aspart  0-5 Units Subcutaneous QHS  . insulin aspart  0-9 Units Subcutaneous TID WC  . insulin glargine  10 Units Subcutaneous q morning - 10a  . latanoprost  1 drop Both Eyes QHS  . mometasone-formoterol  2 puff Inhalation BID  . pravastatin  20 mg Oral QHS  . warfarin  1 mg Oral ONCE-1600  . Warfarin - Pharmacist Dosing Inpatient   Does not apply q1600   Continuous Infusions:  Procedures/Studies: DG Chest Port 1 View  Result Date: 10/23/2019 CLINICAL DATA:  Shortness of breath. EXAM: PORTABLE CHEST 1 VIEW COMPARISON:  06/30/2015 chest radiograph. FINDINGS: Left basilar scarring, unchanged. No new focal airspace opacity. No pneumothorax or pleural effusion. Mild central pulmonary vascular congestion. Aortic atherosclerotic calcifications. Cardiomediastinal silhouette is otherwise unremarkable. No acute osseous abnormality. IMPRESSION: Mild central pulmonary vascular congestion without frank edema. Left basilar scarring, unchanged. Electronically Signed   By: Primitivo Gauze M.D.   On: 10/23/2019 12:49   ECHOCARDIOGRAM COMPLETE  Result Date: 10/25/2019    ECHOCARDIOGRAM REPORT   Patient Name:   Cynthia James Date of Exam: 10/24/2019 Medical Rec #:  [id number]            Height:       60.0 in Accession #:    [id number]           Weight:       192.0 lb Date of Birth:  Jan 18, 1930            BSA:          1.835 m Patient Age:    34 years             BP:           166/89 mmHg Patient Gender: F                    HR:           84 bpm. Exam Location:  [facility] Procedure: 2D Echo Indications:    Dyspnea 786.09 / R06.00  History:        Patient has no prior history of Echocardiogram examinations.                 COPD, Arrythmias:Atrial Fibrillation, Signs/Symptoms:Dyspnea;                 Risk Factors:Hypertension, Diabetes, Dyslipidemia and Former                 Smoker.  Sonographer:    Leavy Cella RDCS (AE) Referring  Phys: [place]  1. Left ventricular ejection fraction, by estimation, is 70 to 75%. The left ventricle has hyperdynamic function. The left ventricle has no regional wall motion abnormalities. There is moderate left ventricular hypertrophy. Left ventricular diastolic parameters are indeterminate.  2. Right ventricular systolic function is normal.  The right ventricular size is normal. There is mildly elevated pulmonary artery systolic pressure.  3. The mitral valve is abnormal. Trivial mitral valve regurgitation.  4. AV is thickened, calcified Significant calcification on noncoronary cusp. Peak and mean gradients through the valve are 14 and 7 mm Hg consistent with mild AS. Would recomm follow up echo in 6 to 12 months. The aortic valve is tricuspid. Aortic valve  regurgitation is not visualized.  5. The inferior vena cava is normal in size with <50% respiratory variability, suggesting right atrial pressure of 8 mmHg. FINDINGS  Left Ventricle: Left ventricular ejection fraction, by estimation, is 70 to 75%. The left ventricle has hyperdynamic function. The left ventricle has no regional wall motion abnormalities. The left ventricular internal cavity size was normal in size. There is moderate left ventricular hypertrophy. Left ventricular diastolic parameters are indeterminate. Right Ventricle: The right ventricular size is normal. Right vetricular wall thickness was not assessed. Right ventricular systolic function is normal. There is mildly elevated pulmonary artery systolic pressure. The tricuspid regurgitant velocity is 2.80 m/s, and with an assumed right atrial pressure of 10 mmHg, the estimated right ventricular systolic pressure is 16.9 mmHg. Left Atrium: Left atrial size was normal in size. Right Atrium: Right atrial size was normal in size. Pericardium: There is no evidence of pericardial effusion. Mitral Valve: The mitral valve is abnormal. There is mild thickening of the mitral  valve leaflet(s). Mild mitral annular calcification. Trivial mitral valve regurgitation. Tricuspid Valve: The tricuspid valve is normal in structure. Tricuspid valve regurgitation is mild. Aortic Valve: AV is thickened, calcified Significant calcification on noncoronary cusp. Peak and mean gradients through the valve are 14 and 7 mm Hg consistent with mild AS. Would recomm follow up echo in 6 to 12 months. The aortic valve is tricuspid. Aortic valve regurgitation is not visualized. Pulmonic Valve: The pulmonic valve was not well visualized. Pulmonic valve regurgitation is not visualized. Aorta: The aortic root is normal in size and structure. Venous: The inferior vena cava is normal in size with less than 50% respiratory variability, suggesting right atrial pressure of 8 mmHg. IAS/Shunts: No atrial level shunt detected by color flow Doppler.  LEFT VENTRICLE PLAX 2D LVIDd:         3.31 cm  Diastology LVIDs:         1.93 cm  LV e' lateral:   9.68 cm/s LV PW:         1.62 cm  LV E/e' lateral: 9.7 LV IVS:        1.36 cm  LV e' medial:    7.18 cm/s LVOT diam:     1.90 cm  LV E/e' medial:  13.0 LVOT Area:     2.84 cm  RIGHT VENTRICLE RV S prime:     9.46 cm/s TAPSE (M-mode): 1.6 cm LEFT ATRIUM             Index       RIGHT ATRIUM           Index LA diam:        3.40 cm 1.85 cm/m  RA Area:     13.10 cm LA Vol (A2C):   46.3 ml 25.24 ml/m RA Volume:   33.60 ml  18.31 ml/m LA Vol (A4C):   59.4 ml 32.38 ml/m LA Biplane Vol: 52.8 ml 28.78 ml/m   AORTA Ao Root diam: 2.90 cm MITRAL VALVE               TRICUSPID  VALVE MV Area (PHT): 3.17 cm    TR Peak grad:   31.4 mmHg MV Decel Time: 239 msec    TR Vmax:        280.00 cm/s MV E velocity: 93.60 cm/s MV A velocity: 18.90 cm/s  SHUNTS MV E/A ratio:  4.95        Systemic Diam: 1.90 cm Dorris Carnes MD Electronically signed by Dorris Carnes MD Signature Date/Time: 10/25/2019/3:15:13 PM    Final     Orson Eva, DO  [facility]  If 7PM-7AM, please contact  night-coverage [email or web address] Password [email or web address] 10/26/2019, 4:40 PM   LOS: 2 days

## 2019-10-27 LAB — CBC
HCT: 41.5 % (ref 36.0–46.0)
Hemoglobin: 12.8 g/dL (ref 12.0–15.0)
MCH: 30.3 pg (ref 26.0–34.0)
MCHC: 30.8 g/dL (ref 30.0–36.0)
MCV: 98.1 fL (ref 80.0–100.0)
Platelets: 182 10*3/uL (ref 150–400)
RBC: 4.23 MIL/uL (ref 3.87–5.11)
RDW: 13.6 % (ref 11.5–15.5)
WBC: 7.5 10*3/uL (ref 4.0–10.5)
nRBC: 0 % (ref 0.0–0.2)

## 2019-10-27 LAB — BASIC METABOLIC PANEL
Anion gap: 9 (ref 5–15)
BUN: 45 mg/dL — ABNORMAL HIGH (ref 8–23)
CO2: 36 mmol/L — ABNORMAL HIGH (ref 22–32)
Calcium: 8.1 mg/dL — ABNORMAL LOW (ref 8.9–10.3)
Chloride: 95 mmol/L — ABNORMAL LOW (ref 98–111)
Creatinine, Ser: 1.76 mg/dL — ABNORMAL HIGH (ref 0.44–1.00)
GFR calc Af Amer: 30 mL/min — ABNORMAL LOW (ref >60)
GFR calc non Af Amer: 26 mL/min — ABNORMAL LOW (ref >60)
Glucose, Bld: 156 mg/dL — ABNORMAL HIGH (ref 70–99)
Potassium: 3.9 mmol/L (ref 3.5–5.1)
Sodium: 140 mmol/L (ref 135–145)

## 2019-10-27 LAB — GLUCOSE, CAPILLARY
Glucose-Capillary: 161 mg/dL — ABNORMAL HIGH (ref 70–99)
Glucose-Capillary: 310 mg/dL — ABNORMAL HIGH (ref 70–99)

## 2019-10-27 LAB — PROTIME-INR
INR: 2.8 — ABNORMAL HIGH (ref 0.8–1.2)
Prothrombin Time: 28.6 seconds — ABNORMAL HIGH (ref 11.4–15.2)

## 2019-10-27 MED ORDER — WARFARIN SODIUM 1 MG PO TABS: 1.0000 mg | ORAL_TABLET | Freq: Once | ORAL | Status: DC

## 2019-10-27 MED ORDER — CARVEDILOL 6.25 MG PO TABS: 6.2500 mg | ORAL_TABLET | Freq: Two times a day (BID) | ORAL | 1 refills | Status: AC

## 2019-10-27 MED ORDER — FUROSEMIDE 40 MG PO TABS: 40.0000 mg | ORAL_TABLET | Freq: Every day | ORAL | 1 refills | Status: AC

## 2019-10-27 MED ORDER — FUROSEMIDE 40 MG PO TABS: 40.0000 mg | ORAL_TABLET | Freq: Every day | ORAL | Status: DC

## 2019-10-27 NOTE — Discharge Summary (Signed)
Physician Discharge Summary  SHEKELA GOODRIDGE JEH:[id number] DOB: 08/21/1929 DOA: 10/23/2019  PCP: Abran Richard, MD  Admit date: 10/23/2019 Discharge date: 10/27/2019  Admitted From: Home Disposition:  Home   Recommendations for Outpatient Follow-up:  1. Follow up with PCP in 1-2 weeks 2. Please obtain BMP in one week  Home Health: yes Equipment/Devices: HHPT  Discharge Condition: Stable CODE STATUS: FULL Diet recommendation: Heart Healthy / Carb Modified   Brief/Interim Summary: 86 year old female with a history of chronic atrial fibrillation on warfarin, hypertension, hyperlipidemia, CKD stage III, diabetes mellitus type 2 presenting with 2 to 33-month history of shortness of breath that has worsened over the past week. The patient has had increasing dyspnea on exertion. She also describes orthopnea that has been worse over the past week. She has chronic lower extremity edema, but states that this has been about the same as usual. She denies any new medications. She denies any fevers, chills, cough, hemoptysis, nausea, vomiting, diarrhea, abdominal pain. She has not had any chest pain, palpitations, headache, dizziness, syncope. She stated that she was recently started on prednisone and gabapentin for an orthopedic issue in the past month, but she only took 1 dose because it made her feel funny. She has a 10-pack-year history of tobacco quitting smoking in 1996. In the emergency department, the patient was afebrile hemodynamically stable with oxygen saturation down to 89% with ambulation on room air. The patient was admitted for further evaluation of her worsening dyspnea.  Discharge Diagnoses:  Acute respiratory failure with hypoxia -Primarily driven by CHF/pulmonary edema -Also multifactorial including COPD, OSA, morbid obesity -Initially placed on 2 L -Weaned to room air -ambulatory pulse ox on RA on day of d/c did not show desaturation <37%  Acute diastolic  CHF -Continue IV furosemide 40 mg IV twice daily>>>home with lasix 40mg  po daily -Daily weights--185.6 lbs is d/c weight -Accurate I's and O's--NEG 4L -Repeat echocardiogram--EF 70-75%, indeterminant diastolic function, mild AS, trivial MR -Continue carvedilol -Holding losartan to allow blood pressure margin for diuresis  Chronic atrial fibrillation -Rate controlled -CHADS-VASc = 5 -Continue warfarin -Has failed cardioversion and Tikosyn loading August 2018  CKD stage IIIb -Baseline creatinine 1.3-1.6 -Monitor with diuresis -serum creatinine 1.76 on day of d/c  COPD -Stable without wheezing -Continue Dulera  Hyperlipidemia -Continue statin  Diabetes mellitus type 2 -Check hemoglobin A1c--7.5 -NovoLog sliding scale  Obesity class II -BMI 37.50 -Lifestyle modification  OSA -Continue CPAP   Discharge Instructions   Allergies as of 10/27/2019      Reactions   Codeine Nausea And Vomiting   Percocet [oxycodone-acetaminophen] Nausea And Vomiting   Tramadol Nausea And Vomiting   Vicodin [hydrocodone-acetaminophen] Nausea And Vomiting      Medication List    STOP taking these medications   losartan 50 MG tablet Commonly known as: COZAAR     TAKE these medications   acetaminophen 500 MG tablet Commonly known as: TYLENOL Take 500 mg by mouth every 6 (six) hours as needed for mild pain or moderate pain.   albuterol 108 (90 Base) MCG/ACT inhaler Commonly known as: ProAir HFA Inhale 1-2 puffs into the lungs every 6 (six) hours as needed for wheezing or shortness of breath.   calcium carbonate 1500 (600 Ca) MG Tabs tablet Commonly known as: OSCAL Take 600 mg of elemental calcium by mouth 2 (two) times daily with a meal.   carvedilol 6.25 MG tablet Commonly known as: COREG Take 1 tablet (6.25 mg total) by mouth 2 (two) times daily with  a meal. What changed:   medication strength  how much to take  when to take this   Fish Oil Pearls 150 MG  Caps Take 1 capsule by mouth every morning.   fluorometholone 0.1 % ophthalmic suspension Commonly known as: FML Place 1 drop into both eyes every morning.   furosemide 40 MG tablet Commonly known as: LASIX Take 1 tablet (40 mg total) by mouth daily. Start taking on: October 28, 2019 What changed:   medication strength  how much to take   insulin glargine 100 UNIT/ML injection Commonly known as: LANTUS Inject 20 Units into the skin every morning.   latanoprost 0.005 % ophthalmic solution Commonly known as: XALATAN Place 1 drop into both eyes at bedtime.   pravastatin 20 MG tablet Commonly known as: PRAVACHOL Take 20 mg by mouth at bedtime.   warfarin 4 MG tablet Commonly known as: COUMADIN Take 4 mg by mouth every evening.   Wixela Inhub 250-50 MCG/DOSE Aepb Generic drug: Fluticasone-Salmeterol Inhale 1 puff into the lungs daily as needed (for shortness of breath).       Allergies  Allergen Reactions  . Codeine Nausea And Vomiting  . Percocet [Oxycodone-Acetaminophen] Nausea And Vomiting  . Tramadol Nausea And Vomiting  . Vicodin [Hydrocodone-Acetaminophen] Nausea And Vomiting    Consultations:  none   Procedures/Studies: DG Chest Port 1 View  Result Date: 10/23/2019 CLINICAL DATA:  Shortness of breath. EXAM: PORTABLE CHEST 1 VIEW COMPARISON:  06/30/2015 chest radiograph. FINDINGS: Left basilar scarring, unchanged. No new focal airspace opacity. No pneumothorax or pleural effusion. Mild central pulmonary vascular congestion. Aortic atherosclerotic calcifications. Cardiomediastinal silhouette is otherwise unremarkable. No acute osseous abnormality. IMPRESSION: Mild central pulmonary vascular congestion without frank edema. Left basilar scarring, unchanged. Electronically Signed   By: Primitivo Gauze M.D.   On: 10/23/2019 12:49   ECHOCARDIOGRAM COMPLETE  Result Date: 10/25/2019    ECHOCARDIOGRAM REPORT   Patient Name:   RENADA CRONIN Date of Exam:  10/24/2019 Medical Rec #:  [id number]            Height:       60.0 in Accession #:    [id number]           Weight:       192.0 lb Date of Birth:  19-Nov-1929            BSA:          1.835 m Patient Age:    86 years             BP:           166/89 mmHg Patient Gender: F                    HR:           84 bpm. Exam Location:  [facility] Procedure: 2D Echo Indications:    Dyspnea 786.09 / R06.00  History:        Patient has no prior history of Echocardiogram examinations.                 COPD, Arrythmias:Atrial Fibrillation, Signs/Symptoms:Dyspnea;                 Risk Factors:Hypertension, Diabetes, Dyslipidemia and Former                 Smoker.  Sonographer:    Leavy Cella RDCS (AE) Referring Phys: [place]  1. Left ventricular ejection fraction, by  estimation, is 70 to 75%. The left ventricle has hyperdynamic function. The left ventricle has no regional wall motion abnormalities. There is moderate left ventricular hypertrophy. Left ventricular diastolic parameters are indeterminate.  2. Right ventricular systolic function is normal. The right ventricular size is normal. There is mildly elevated pulmonary artery systolic pressure.  3. The mitral valve is abnormal. Trivial mitral valve regurgitation.  4. AV is thickened, calcified Significant calcification on noncoronary cusp. Peak and mean gradients through the valve are 14 and 7 mm Hg consistent with mild AS. Would recomm follow up echo in 6 to 12 months. The aortic valve is tricuspid. Aortic valve  regurgitation is not visualized.  5. The inferior vena cava is normal in size with <50% respiratory variability, suggesting right atrial pressure of 8 mmHg. FINDINGS  Left Ventricle: Left ventricular ejection fraction, by estimation, is 70 to 75%. The left ventricle has hyperdynamic function. The left ventricle has no regional wall motion abnormalities. The left ventricular internal cavity size was normal in size. There is moderate left  ventricular hypertrophy. Left ventricular diastolic parameters are indeterminate. Right Ventricle: The right ventricular size is normal. Right vetricular wall thickness was not assessed. Right ventricular systolic function is normal. There is mildly elevated pulmonary artery systolic pressure. The tricuspid regurgitant velocity is 2.80 m/s, and with an assumed right atrial pressure of 10 mmHg, the estimated right ventricular systolic pressure is 27.0 mmHg. Left Atrium: Left atrial size was normal in size. Right Atrium: Right atrial size was normal in size. Pericardium: There is no evidence of pericardial effusion. Mitral Valve: The mitral valve is abnormal. There is mild thickening of the mitral valve leaflet(s). Mild mitral annular calcification. Trivial mitral valve regurgitation. Tricuspid Valve: The tricuspid valve is normal in structure. Tricuspid valve regurgitation is mild. Aortic Valve: AV is thickened, calcified Significant calcification on noncoronary cusp. Peak and mean gradients through the valve are 14 and 7 mm Hg consistent with mild AS. Would recomm follow up echo in 6 to 12 months. The aortic valve is tricuspid. Aortic valve regurgitation is not visualized. Pulmonic Valve: The pulmonic valve was not well visualized. Pulmonic valve regurgitation is not visualized. Aorta: The aortic root is normal in size and structure. Venous: The inferior vena cava is normal in size with less than 50% respiratory variability, suggesting right atrial pressure of 8 mmHg. IAS/Shunts: No atrial level shunt detected by color flow Doppler.  LEFT VENTRICLE PLAX 2D LVIDd:         3.31 cm  Diastology LVIDs:         1.93 cm  LV e' lateral:   9.68 cm/s LV PW:         1.62 cm  LV E/e' lateral: 9.7 LV IVS:        1.36 cm  LV e' medial:    7.18 cm/s LVOT diam:     1.90 cm  LV E/e' medial:  13.0 LVOT Area:     2.84 cm  RIGHT VENTRICLE RV S prime:     9.46 cm/s TAPSE (M-mode): 1.6 cm LEFT ATRIUM             Index       RIGHT ATRIUM            Index LA diam:        3.40 cm 1.85 cm/m  RA Area:     13.10 cm LA Vol (A2C):   46.3 ml 25.24 ml/m RA Volume:   33.60 ml  18.31 ml/m  LA Vol (A4C):   59.4 ml 32.38 ml/m LA Biplane Vol: 52.8 ml 28.78 ml/m   AORTA Ao Root diam: 2.90 cm MITRAL VALVE               TRICUSPID VALVE MV Area (PHT): 3.17 cm    TR Peak grad:   31.4 mmHg MV Decel Time: 239 msec    TR Vmax:        280.00 cm/s MV E velocity: 93.60 cm/s MV A velocity: 18.90 cm/s  SHUNTS MV E/A ratio:  4.95        Systemic Diam: 1.90 cm Dorris Carnes MD Electronically signed by Dorris Carnes MD Signature Date/Time: 10/25/2019/3:15:13 PM    Final          Discharge Exam: Vitals:   10/27/19 0251 10/27/19 0835  BP: 130/69   Pulse: 88   Resp: 16   Temp: 99.2 F (37.3 C)   SpO2: 100% 96%   Vitals:   10/26/19 2043 10/26/19 2304 10/27/19 0251 10/27/19 0835  BP: 100/69  130/69   Pulse: 92 80 88   Resp: 18 16 16    Temp: 98.2 F (36.8 C)  99.2 F (37.3 C)   TempSrc: Oral  Oral   SpO2: 92% 96% 100% 96%  Weight:      Height:        General: Pt is alert, awake, not in acute distress Cardiovascular: iRRR, S1/S2 +, no rubs, no gallops Respiratory: fine bibasilar rales. No wheeze Abdominal: Soft, NT, ND, bowel sounds + Extremities: trace edema, no cyanosis   The results of significant diagnostics from this hospitalization (including imaging, microbiology, ancillary and laboratory) are listed below for reference.    Significant Diagnostic Studies: DG Chest Port 1 View  Result Date: 10/23/2019 CLINICAL DATA:  Shortness of breath. EXAM: PORTABLE CHEST 1 VIEW COMPARISON:  06/30/2015 chest radiograph. FINDINGS: Left basilar scarring, unchanged. No new focal airspace opacity. No pneumothorax or pleural effusion. Mild central pulmonary vascular congestion. Aortic atherosclerotic calcifications. Cardiomediastinal silhouette is otherwise unremarkable. No acute osseous abnormality. IMPRESSION: Mild central pulmonary vascular congestion  without frank edema. Left basilar scarring, unchanged. Electronically Signed   By: Primitivo Gauze M.D.   On: 10/23/2019 12:49   ECHOCARDIOGRAM COMPLETE  Result Date: 10/25/2019    ECHOCARDIOGRAM REPORT   Patient Name:   JAYDYN MENON Date of Exam: 10/24/2019 Medical Rec #:  [id number]            Height:       60.0 in Accession #:    [id number]           Weight:       192.0 lb Date of Birth:  [birth year]            BSA:          1.835 m Patient Age:    6 years             BP:           166/89 mmHg Patient Gender: F                    HR:           84 bpm. Exam Location:  [facility] Procedure: 2D Echo Indications:    Dyspnea 786.09 / R06.00  History:        Patient has no prior history of Echocardiogram examinations.                 COPD,  Arrythmias:Atrial Fibrillation, Signs/Symptoms:Dyspnea;                 Risk Factors:Hypertension, Diabetes, Dyslipidemia and Former                 Smoker.  Sonographer:    Leavy Cella RDCS (AE) Referring Phys: [place]  1. Left ventricular ejection fraction, by estimation, is 70 to 75%. The left ventricle has hyperdynamic function. The left ventricle has no regional wall motion abnormalities. There is moderate left ventricular hypertrophy. Left ventricular diastolic parameters are indeterminate.  2. Right ventricular systolic function is normal. The right ventricular size is normal. There is mildly elevated pulmonary artery systolic pressure.  3. The mitral valve is abnormal. Trivial mitral valve regurgitation.  4. AV is thickened, calcified Significant calcification on noncoronary cusp. Peak and mean gradients through the valve are 14 and 7 mm Hg consistent with mild AS. Would recomm follow up echo in 6 to 12 months. The aortic valve is tricuspid. Aortic valve  regurgitation is not visualized.  5. The inferior vena cava is normal in size with <50% respiratory variability, suggesting right atrial pressure of 8 mmHg. FINDINGS  Left  Ventricle: Left ventricular ejection fraction, by estimation, is 70 to 75%. The left ventricle has hyperdynamic function. The left ventricle has no regional wall motion abnormalities. The left ventricular internal cavity size was normal in size. There is moderate left ventricular hypertrophy. Left ventricular diastolic parameters are indeterminate. Right Ventricle: The right ventricular size is normal. Right vetricular wall thickness was not assessed. Right ventricular systolic function is normal. There is mildly elevated pulmonary artery systolic pressure. The tricuspid regurgitant velocity is 2.80 m/s, and with an assumed right atrial pressure of 10 mmHg, the estimated right ventricular systolic pressure is 77.4 mmHg. Left Atrium: Left atrial size was normal in size. Right Atrium: Right atrial size was normal in size. Pericardium: There is no evidence of pericardial effusion. Mitral Valve: The mitral valve is abnormal. There is mild thickening of the mitral valve leaflet(s). Mild mitral annular calcification. Trivial mitral valve regurgitation. Tricuspid Valve: The tricuspid valve is normal in structure. Tricuspid valve regurgitation is mild. Aortic Valve: AV is thickened, calcified Significant calcification on noncoronary cusp. Peak and mean gradients through the valve are 14 and 7 mm Hg consistent with mild AS. Would recomm follow up echo in 6 to 12 months. The aortic valve is tricuspid. Aortic valve regurgitation is not visualized. Pulmonic Valve: The pulmonic valve was not well visualized. Pulmonic valve regurgitation is not visualized. Aorta: The aortic root is normal in size and structure. Venous: The inferior vena cava is normal in size with less than 50% respiratory variability, suggesting right atrial pressure of 8 mmHg. IAS/Shunts: No atrial level shunt detected by color flow Doppler.  LEFT VENTRICLE PLAX 2D LVIDd:         3.31 cm  Diastology LVIDs:         1.93 cm  LV e' lateral:   9.68 cm/s LV PW:          1.62 cm  LV E/e' lateral: 9.7 LV IVS:        1.36 cm  LV e' medial:    7.18 cm/s LVOT diam:     1.90 cm  LV E/e' medial:  13.0 LVOT Area:     2.84 cm  RIGHT VENTRICLE RV S prime:     9.46 cm/s TAPSE (M-mode): 1.6 cm LEFT ATRIUM  Index       RIGHT ATRIUM           Index LA diam:        3.40 cm 1.85 cm/m  RA Area:     13.10 cm LA Vol (A2C):   46.3 ml 25.24 ml/m RA Volume:   33.60 ml  18.31 ml/m LA Vol (A4C):   59.4 ml 32.38 ml/m LA Biplane Vol: 52.8 ml 28.78 ml/m   AORTA Ao Root diam: 2.90 cm MITRAL VALVE               TRICUSPID VALVE MV Area (PHT): 3.17 cm    TR Peak grad:   31.4 mmHg MV Decel Time: 239 msec    TR Vmax:        280.00 cm/s MV E velocity: 93.60 cm/s MV A velocity: 18.90 cm/s  SHUNTS MV E/A ratio:  4.95        Systemic Diam: 1.90 cm Dorris Carnes MD Electronically signed by Dorris Carnes MD Signature Date/Time: 10/25/2019/3:15:13 PM    Final      Microbiology: Recent Results (from the past 240 hour(s))  SARS Coronavirus 2 by RT PCR (hospital order, performed in [facility] hospital lab) Nasopharyngeal Nasopharyngeal Swab     Status: None   Collection Time: 10/23/19  1:42 PM   Specimen: Nasopharyngeal Swab  Result Value Ref Range Status   SARS Coronavirus 2 NEGATIVE NEGATIVE Final    Comment: (NOTE) SARS-CoV-2 target nucleic acids are NOT DETECTED. The SARS-CoV-2 RNA is generally detectable in upper and lower respiratory specimens during the acute phase of infection. The lowest concentration of SARS-CoV-2 viral copies this assay can detect is 250 copies / mL. A negative result does not preclude SARS-CoV-2 infection and should not be used as the sole basis for treatment or other patient management decisions.  A negative result may occur with improper specimen collection / handling, submission of specimen other than nasopharyngeal swab, presence of viral mutation(s) within the areas targeted by this assay, and inadequate number of viral copies (<250 copies / mL). A  negative result must be combined with clinical observations, patient history, and epidemiological information. Fact Sheet for Patients:   StrictlyIdeas.no Fact Sheet for Healthcare Providers: BankingDealers.co.za This test is not yet approved or cleared  by the [place] FDA and has been authorized for detection and/or diagnosis of SARS-CoV-2 by FDA under an Emergency Use Authorization (EUA).  This EUA will remain in effect (meaning this test can be used) for the duration of the COVID-19 declaration under Section 564(b)(1) of the Act, 21 U.S.C. section 360bbb-3(b)(1), unless the authorization is terminated or revoked sooner. Performed at [facility], [address] [place],  [address]      Labs: Basic Metabolic Panel: Recent Labs  Lab 10/23/19 1149 10/23/19 1149 10/24/19 0426 10/24/19 0426 10/25/19 0609 10/25/19 0609 10/26/19 0548 10/27/19 0436  NA 142  --  143  --  143  --  142 140  K 4.6   < > 3.9   < > 4.0   < > 3.4* 3.9  CL 100  --  99  --  94*  --  93* 95*  CO2 33*  --  33*  --  36*  --  38* 36*  GLUCOSE 118*  --  114*  --  129*  --  127* 156*  BUN 29*  --  31*  --  39*  --  40* 45*  CREATININE 1.54*  --  1.46*  --  1.65*  --  1.63* 1.76*  CALCIUM 9.0  --  8.5*  --  8.7*  --  8.3* 8.1*  MG  --   --   --   --  2.0  --  1.9  --    < > = values in this interval not displayed.   Liver Function Tests: Recent Labs  Lab 10/23/19 1149  AST 25  ALT 17  ALKPHOS 63  BILITOT 0.5  PROT 6.6  ALBUMIN 3.7   No results for input(s): LIPASE, AMYLASE in the last 168 hours. No results for input(s): AMMONIA in the last 168 hours. CBC: Recent Labs  Lab 10/23/19 1149 10/24/19 0426 10/25/19 0609 10/26/19 0548 10/27/19 0436  WBC 5.7 6.1 7.8 8.7 7.5  NEUTROABS 3.3  --   --   --   --   HGB 13.1 12.5 13.5 12.5 12.8  HCT 43.6 42.1 45.0 40.9 41.5  MCV 98.6 100.0 99.1 96.9 98.1  PLT 201 201 208 179 182   Cardiac  Enzymes: No results for input(s): CKTOTAL, CKMB, CKMBINDEX, TROPONINI in the last 168 hours. BNP: Invalid input(s): POCBNP CBG: Recent Labs  Lab 10/26/19 1110 10/26/19 1627 10/26/19 2201 10/27/19 0722 10/27/19 1107  GLUCAP 185* 195* 187* 161* 310*    Time coordinating discharge:  36 minutes  Signed:  Orson Eva, DO [facility] Pager: [phone] 10/27/2019, 11:50 AM

## 2019-10-27 NOTE — Progress Notes (Signed)
Nsg Discharge Note  Admit Date:  10/23/2019 Discharge date: 10/27/2019   [name] to be D/C'd Home  per MD order.  AVS completed.  Patient/caregiver able to verbalize understanding.  Discharge Medication: Allergies as of 10/27/2019      Reactions   Codeine Nausea And Vomiting   Percocet [oxycodone-acetaminophen] Nausea And Vomiting   Tramadol Nausea And Vomiting   Vicodin [hydrocodone-acetaminophen] Nausea And Vomiting      Medication List    STOP taking these medications   losartan 50 MG tablet Commonly known as: COZAAR     TAKE these medications   acetaminophen 500 MG tablet Commonly known as: TYLENOL Take 500 mg by mouth every 6 (six) hours as needed for mild pain or moderate pain.   albuterol 108 (90 Base) MCG/ACT inhaler Commonly known as: ProAir HFA Inhale 1-2 puffs into the lungs every 6 (six) hours as needed for wheezing or shortness of breath.   calcium carbonate 1500 (600 Ca) MG Tabs tablet Commonly known as: OSCAL Take 600 mg of elemental calcium by mouth 2 (two) times daily with a meal.   carvedilol 6.25 MG tablet Commonly known as: COREG Take 1 tablet (6.25 mg total) by mouth 2 (two) times daily with a meal. What changed:   medication strength  how much to take  when to take this   Fish Oil Pearls 150 MG Caps Take 1 capsule by mouth every morning.   fluorometholone 0.1 % ophthalmic suspension Commonly known as: FML Place 1 drop into both eyes every morning.   furosemide 40 MG tablet Commonly known as: LASIX Take 1 tablet (40 mg total) by mouth daily. Start taking on: October 28, 2019 What changed:   medication strength  how much to take   insulin glargine 100 UNIT/ML injection Commonly known as: LANTUS Inject 20 Units into the skin every morning.   latanoprost 0.005 % ophthalmic solution Commonly known as: XALATAN Place 1 drop into both eyes at bedtime.   pravastatin 20 MG tablet Commonly known as: PRAVACHOL Take 20 mg by mouth  at bedtime.   warfarin 4 MG tablet Commonly known as: COUMADIN Take 4 mg by mouth every evening.   Wixela Inhub 250-50 MCG/DOSE Aepb Generic drug: Fluticasone-Salmeterol Inhale 1 puff into the lungs daily as needed (for shortness of breath).       Discharge Assessment: Vitals:   10/27/19 0251 10/27/19 0835  BP: 130/69   Pulse: 88   Resp: 16   Temp: 99.2 F (37.3 C)   SpO2: 100% 96%   Skin clean, dry and intact without evidence of skin break down, no evidence of skin tears noted. IV catheter discontinued intact. Site without signs and symptoms of complications - no redness or edema noted at insertion site, patient denies c/o pain - only slight tenderness at site.  Dressing with slight pressure applied.  D/c Instructions-Education: Discharge instructions given to patient/family with verbalized understanding. D/c education completed with patient/family including follow up instructions, medication list, d/c activities limitations if indicated, with other d/c instructions as indicated by MD - patient able to verbalize understanding, all questions fully answered. Patient instructed to return to ED, call 911, or call MD for any changes in condition.  Patient escorted via [place], and D/C home via private auto.  Makyiah Lie Loletha Grayer, RN 10/27/2019 11:49 AM

## 2019-10-27 NOTE — Progress Notes (Signed)
ANTICOAGULATION CONSULT NOTE - Initial Consult  Pharmacy Consult for warfarin Indication: atrial fibrillation  Allergies  Allergen Reactions  . Codeine Nausea And Vomiting  . Percocet [Oxycodone-Acetaminophen] Nausea And Vomiting  . Tramadol Nausea And Vomiting  . Vicodin [Hydrocodone-Acetaminophen] Nausea And Vomiting    Patient Measurements: Height: 5' (152.4 cm) Weight: 84.2 kg (185 lb 10 oz) IBW/kg (Calculated) : 45.5  Vital Signs: Temp: 99.2 F (37.3 C) (06/06 0251) Temp Source: Oral (06/06 0251) BP: 130/69 (06/06 0251) Pulse Rate: 88 (06/06 0251)  Labs: Recent Labs    10/25/19 0609 10/25/19 0609 10/26/19 0548 10/27/19 0436  HGB 13.5   < > 12.5 12.8  HCT 45.0  --  40.9 41.5  PLT 208  --  179 182  LABPROT 28.7*  --  29.7* 28.6*  INR 2.8*  --  2.9* 2.8*  CREATININE 1.65*  --  1.63* 1.76*   < > = values in this interval not displayed.    Estimated Creatinine Clearance: 21.7 mL/min (A) (by C-G formula based on SCr of 1.76 mg/dL (H)).  Assessment: 86 yo W on warfarin PTA for afib (CHADS2VASc = 5). Last dose 6/1. Admit INR was therapeutic at 2.3. Last outpatient INR was 4.5 on 09/23/19. H/H and platelets stable.   PTA warfarin dose = 4mg  daily   Goal of Therapy:  INR 2-3 Monitor platelets by anticoagulation protocol: Yes    INR: 2.8 CBC: remains WNL   Plan:  Give warfarin 1mg  x1 today (25% of home dose) Daily INR and  CBC Monitor for signs/symptoms of bleeding    Thomasenia Sales, PharmD, MBA, BCGP Clinical Pharmacist  10/27/2019 8:30 AM

## 2019-10-27 NOTE — TOC Transition Note (Signed)
Transition of Care [facility]) - CM/SW Discharge Note   Patient Details  Name: TANAZIA ACHEE MRN: [id number] Date of Birth: 11-08-1929  Transition of Care [facility]) CM/SW Contact:  Shade Flood, LCSW Phone Number: 10/27/2019, 1:27 PM   Clinical Narrative:     Pt stable for dc today per MD. MD ordering [facility] PT for pt. Spoke with pt by phone to discuss. Pt agreeable to referral. Discussed CMS providers and referred as requested. [facility] staff will follow pt at home.  There are no other TOC needs for dc.  Final next level of care: [place] Barriers to Discharge: Barriers Resolved   Patient Goals and CMS Choice        Discharge Placement                       Discharge Plan and Services                          HH Arranged: PT [facility] Agency: [place] Date [facility] Agency Contacted: 10/27/19 Time [place]: 1021 Representative spoke with at [place]: Cindie  Social Determinants of Health ([place]) Interventions     Readmission Risk Interventions No flowsheet data found.

## 2020-01-02 ENCOUNTER — Other Ambulatory Visit: Payer: Self-pay

## 2020-01-02 ENCOUNTER — Inpatient Hospital Stay (HOSPITAL_COMMUNITY): Payer: Medicare HMO

## 2020-01-02 ENCOUNTER — Encounter (HOSPITAL_COMMUNITY): Payer: Self-pay

## 2020-01-02 ENCOUNTER — Emergency Department (HOSPITAL_COMMUNITY): Payer: Medicare HMO

## 2020-01-02 DIAGNOSIS — R410 Disorientation, unspecified: Secondary | ICD-10-CM | POA: Diagnosis not present

## 2020-01-02 DIAGNOSIS — I213 ST elevation (STEMI) myocardial infarction of unspecified site: Secondary | ICD-10-CM | POA: Diagnosis not present

## 2020-01-02 DIAGNOSIS — I130 Hypertensive heart and chronic kidney disease with heart failure and stage 1 through stage 4 chronic kidney disease, or unspecified chronic kidney disease: Secondary | ICD-10-CM | POA: Diagnosis present

## 2020-01-02 DIAGNOSIS — E7800 Pure hypercholesterolemia, unspecified: Secondary | ICD-10-CM | POA: Diagnosis present

## 2020-01-02 DIAGNOSIS — D72829 Elevated white blood cell count, unspecified: Secondary | ICD-10-CM | POA: Diagnosis present

## 2020-01-02 DIAGNOSIS — R52 Pain, unspecified: Secondary | ICD-10-CM | POA: Diagnosis not present

## 2020-01-02 DIAGNOSIS — R570 Cardiogenic shock: Secondary | ICD-10-CM | POA: Diagnosis not present

## 2020-01-02 DIAGNOSIS — D631 Anemia in chronic kidney disease: Secondary | ICD-10-CM | POA: Diagnosis present

## 2020-01-02 DIAGNOSIS — Z515 Encounter for palliative care: Secondary | ICD-10-CM | POA: Diagnosis not present

## 2020-01-02 DIAGNOSIS — N183 Chronic kidney disease, stage 3 unspecified: Secondary | ICD-10-CM | POA: Diagnosis not present

## 2020-01-02 DIAGNOSIS — F419 Anxiety disorder, unspecified: Secondary | ICD-10-CM | POA: Diagnosis present

## 2020-01-02 DIAGNOSIS — Z66 Do not resuscitate: Secondary | ICD-10-CM | POA: Diagnosis not present

## 2020-01-02 DIAGNOSIS — R000 Tachycardia, unspecified: Secondary | ICD-10-CM | POA: Diagnosis present

## 2020-01-02 DIAGNOSIS — Z6836 Body mass index (BMI) 36.0-36.9, adult: Secondary | ICD-10-CM

## 2020-01-02 DIAGNOSIS — I482 Chronic atrial fibrillation, unspecified: Secondary | ICD-10-CM | POA: Diagnosis present

## 2020-01-02 DIAGNOSIS — J449 Chronic obstructive pulmonary disease, unspecified: Secondary | ICD-10-CM | POA: Diagnosis present

## 2020-01-02 DIAGNOSIS — N1832 Chronic kidney disease, stage 3b: Secondary | ICD-10-CM | POA: Diagnosis not present

## 2020-01-02 DIAGNOSIS — Z8249 Family history of ischemic heart disease and other diseases of the circulatory system: Secondary | ICD-10-CM

## 2020-01-02 DIAGNOSIS — E1122 Type 2 diabetes mellitus with diabetic chronic kidney disease: Secondary | ICD-10-CM | POA: Diagnosis present

## 2020-01-02 DIAGNOSIS — N184 Chronic kidney disease, stage 4 (severe): Secondary | ICD-10-CM | POA: Diagnosis present

## 2020-01-02 DIAGNOSIS — R0682 Tachypnea, not elsewhere classified: Secondary | ICD-10-CM | POA: Diagnosis not present

## 2020-01-02 DIAGNOSIS — N179 Acute kidney failure, unspecified: Secondary | ICD-10-CM | POA: Diagnosis not present

## 2020-01-02 DIAGNOSIS — I214 Non-ST elevation (NSTEMI) myocardial infarction: Secondary | ICD-10-CM

## 2020-01-02 DIAGNOSIS — I272 Pulmonary hypertension, unspecified: Secondary | ICD-10-CM | POA: Diagnosis present

## 2020-01-02 DIAGNOSIS — I4821 Permanent atrial fibrillation: Secondary | ICD-10-CM | POA: Diagnosis present

## 2020-01-02 DIAGNOSIS — I5033 Acute on chronic diastolic (congestive) heart failure: Secondary | ICD-10-CM | POA: Diagnosis not present

## 2020-01-02 DIAGNOSIS — I4891 Unspecified atrial fibrillation: Secondary | ICD-10-CM

## 2020-01-02 DIAGNOSIS — N289 Disorder of kidney and ureter, unspecified: Secondary | ICD-10-CM

## 2020-01-02 DIAGNOSIS — Z20822 Contact with and (suspected) exposure to covid-19: Secondary | ICD-10-CM | POA: Diagnosis present

## 2020-01-02 DIAGNOSIS — G4733 Obstructive sleep apnea (adult) (pediatric): Secondary | ICD-10-CM | POA: Diagnosis present

## 2020-01-02 DIAGNOSIS — R682 Dry mouth, unspecified: Secondary | ICD-10-CM | POA: Diagnosis not present

## 2020-01-02 DIAGNOSIS — Z87891 Personal history of nicotine dependence: Secondary | ICD-10-CM

## 2020-01-02 DIAGNOSIS — Z8781 Personal history of (healed) traumatic fracture: Secondary | ICD-10-CM

## 2020-01-02 DIAGNOSIS — M810 Age-related osteoporosis without current pathological fracture: Secondary | ICD-10-CM | POA: Diagnosis present

## 2020-01-02 DIAGNOSIS — E785 Hyperlipidemia, unspecified: Secondary | ICD-10-CM | POA: Diagnosis present

## 2020-01-02 DIAGNOSIS — Z7901 Long term (current) use of anticoagulants: Secondary | ICD-10-CM | POA: Diagnosis not present

## 2020-01-02 DIAGNOSIS — I10 Essential (primary) hypertension: Secondary | ICD-10-CM | POA: Diagnosis present

## 2020-01-02 DIAGNOSIS — Z7189 Other specified counseling: Secondary | ICD-10-CM | POA: Diagnosis not present

## 2020-01-02 DIAGNOSIS — R0602 Shortness of breath: Secondary | ICD-10-CM

## 2020-01-02 DIAGNOSIS — Z9842 Cataract extraction status, left eye: Secondary | ICD-10-CM

## 2020-01-02 DIAGNOSIS — I5043 Acute on chronic combined systolic (congestive) and diastolic (congestive) heart failure: Secondary | ICD-10-CM | POA: Diagnosis present

## 2020-01-02 DIAGNOSIS — M1990 Unspecified osteoarthritis, unspecified site: Secondary | ICD-10-CM | POA: Diagnosis present

## 2020-01-02 DIAGNOSIS — I071 Rheumatic tricuspid insufficiency: Secondary | ICD-10-CM | POA: Diagnosis present

## 2020-01-02 DIAGNOSIS — R54 Age-related physical debility: Secondary | ICD-10-CM | POA: Diagnosis present

## 2020-01-02 DIAGNOSIS — Z452 Encounter for adjustment and management of vascular access device: Secondary | ICD-10-CM

## 2020-01-02 DIAGNOSIS — Z833 Family history of diabetes mellitus: Secondary | ICD-10-CM

## 2020-01-02 DIAGNOSIS — Z90710 Acquired absence of both cervix and uterus: Secondary | ICD-10-CM

## 2020-01-02 DIAGNOSIS — Z885 Allergy status to narcotic agent status: Secondary | ICD-10-CM

## 2020-01-02 DIAGNOSIS — Z9841 Cataract extraction status, right eye: Secondary | ICD-10-CM

## 2020-01-02 DIAGNOSIS — Z794 Long term (current) use of insulin: Secondary | ICD-10-CM

## 2020-01-02 DIAGNOSIS — Z79899 Other long term (current) drug therapy: Secondary | ICD-10-CM

## 2020-01-02 DIAGNOSIS — R531 Weakness: Secondary | ICD-10-CM | POA: Diagnosis not present

## 2020-01-02 LAB — CBC WITH DIFFERENTIAL/PLATELET
Abs Immature Granulocytes: 0.04 10*3/uL (ref 0.00–0.07)
Basophils Absolute: 0.1 10*3/uL (ref 0.0–0.1)
Basophils Relative: 1 %
Eosinophils Absolute: 0.1 10*3/uL (ref 0.0–0.5)
Eosinophils Relative: 1 %
HCT: 46.9 % — ABNORMAL HIGH (ref 36.0–46.0)
Hemoglobin: 14.3 g/dL (ref 12.0–15.0)
Immature Granulocytes: 0 %
Lymphocytes Relative: 10 %
Lymphs Abs: 1.3 10*3/uL (ref 0.7–4.0)
MCH: 30 pg (ref 26.0–34.0)
MCHC: 30.5 g/dL (ref 30.0–36.0)
MCV: 98.3 fL (ref 80.0–100.0)
Monocytes Absolute: 1.1 10*3/uL — ABNORMAL HIGH (ref 0.1–1.0)
Monocytes Relative: 9 %
Neutro Abs: 10.2 10*3/uL — ABNORMAL HIGH (ref 1.7–7.7)
Neutrophils Relative %: 79 %
Platelets: 206 10*3/uL (ref 150–400)
RBC: 4.77 MIL/uL (ref 3.87–5.11)
RDW: 13.4 % (ref 11.5–15.5)
WBC: 12.9 10*3/uL — ABNORMAL HIGH (ref 4.0–10.5)
nRBC: 0 % (ref 0.0–0.2)

## 2020-01-02 LAB — BASIC METABOLIC PANEL
Anion gap: 13 (ref 5–15)
BUN: 28 mg/dL — ABNORMAL HIGH (ref 8–23)
CO2: 28 mmol/L (ref 22–32)
Calcium: 9.3 mg/dL (ref 8.9–10.3)
Chloride: 97 mmol/L — ABNORMAL LOW (ref 98–111)
Creatinine, Ser: 1.66 mg/dL — ABNORMAL HIGH (ref 0.44–1.00)
GFR calc Af Amer: 32 mL/min — ABNORMAL LOW (ref >60)
GFR calc non Af Amer: 27 mL/min — ABNORMAL LOW (ref >60)
Glucose, Bld: 216 mg/dL — ABNORMAL HIGH (ref 70–99)
Potassium: 4 mmol/L (ref 3.5–5.1)
Sodium: 138 mmol/L (ref 135–145)

## 2020-01-02 LAB — URINALYSIS, COMPLETE (UACMP) WITH MICROSCOPIC
Bacteria, UA: NONE SEEN
Bilirubin Urine: NEGATIVE
Glucose, UA: NEGATIVE mg/dL
Hgb urine dipstick: NEGATIVE
Ketones, ur: NEGATIVE mg/dL
Nitrite: NEGATIVE
Protein, ur: 30 mg/dL — AB
Specific Gravity, Urine: 1.015 (ref 1.005–1.030)
pH: 5 (ref 5.0–8.0)

## 2020-01-02 LAB — LIPID PANEL
Cholesterol: 135 mg/dL (ref 0–200)
HDL: 47 mg/dL (ref >40)
LDL Cholesterol: 71 mg/dL (ref 0–99)
Total CHOL/HDL Ratio: 2.9 RATIO
Triglycerides: 85 mg/dL (ref <150)
VLDL: 17 mg/dL (ref 0–40)

## 2020-01-02 LAB — PROTIME-INR
INR: 2.9 — ABNORMAL HIGH (ref 0.8–1.2)
INR: 3.3 — ABNORMAL HIGH (ref 0.8–1.2)
Prothrombin Time: 29.2 seconds — ABNORMAL HIGH (ref 11.4–15.2)
Prothrombin Time: 32.6 seconds — ABNORMAL HIGH (ref 11.4–15.2)

## 2020-01-02 LAB — ECHOCARDIOGRAM COMPLETE
AR max vel: 1.63 cm2
AV Area VTI: 1.61 cm2
AV Area mean vel: 1.51 cm2
AV Mean grad: 3.3 mmHg
AV Peak grad: 6.3 mmHg
Ao pk vel: 1.25 m/s
Area-P 1/2: 3.63 cm2
Height: 60 in
Weight: 2944 oz

## 2020-01-02 LAB — TROPONIN I (HIGH SENSITIVITY)
Troponin I (High Sensitivity): 3712 ng/L (ref <18)
Troponin I (High Sensitivity): 4700 ng/L (ref <18)

## 2020-01-02 LAB — MRSA PCR SCREENING: MRSA by PCR: NEGATIVE

## 2020-01-02 LAB — SARS CORONAVIRUS 2 BY RT PCR (HOSPITAL ORDER, PERFORMED IN ~~LOC~~ HOSPITAL LAB): SARS Coronavirus 2: NEGATIVE

## 2020-01-02 LAB — GLUCOSE, CAPILLARY
Glucose-Capillary: 175 mg/dL — ABNORMAL HIGH (ref 70–99)
Glucose-Capillary: 167 mg/dL — ABNORMAL HIGH (ref 70–99)

## 2020-01-02 LAB — BRAIN NATRIURETIC PEPTIDE: B Natriuretic Peptide: 649 pg/mL — ABNORMAL HIGH (ref 0.0–100.0)

## 2020-01-02 MED ORDER — DILTIAZEM HCL-DEXTROSE 125-5 MG/125ML-% IV SOLN (PREMIX)
5.0000 mg/h | INTRAVENOUS | Status: DC
  Administered 2020-01-02: 5 mg/h via INTRAVENOUS
  Filled 2020-01-02: qty 125

## 2020-01-02 MED ORDER — ATORVASTATIN CALCIUM 80 MG PO TABS
80.0000 mg | ORAL_TABLET | Freq: Every day | ORAL | Status: DC
  Administered 2020-01-02 – 2020-01-06 (×5): 80 mg via ORAL
  Filled 2020-01-02: qty 2
  Filled 2020-01-02: qty 1
  Filled 2020-01-02: qty 2
  Filled 2020-01-02 (×2): qty 1
  Filled 2020-01-02: qty 4

## 2020-01-02 MED ORDER — ASPIRIN 81 MG PO CHEW
324.0000 mg | CHEWABLE_TABLET | Freq: Once | ORAL | Status: AC
  Administered 2020-01-02: 324 mg via ORAL
  Filled 2020-01-02: qty 4

## 2020-01-02 MED ORDER — PHYTONADIONE 5 MG PO TABS
2.5000 mg | ORAL_TABLET | Freq: Once | ORAL | Status: AC
  Administered 2020-01-02: 2.5 mg via ORAL
  Filled 2020-01-02: qty 1

## 2020-01-02 MED ORDER — ACETAMINOPHEN 325 MG PO TABS
650.0000 mg | ORAL_TABLET | ORAL | Status: DC | PRN
  Filled 2020-01-02 (×2): qty 2

## 2020-01-02 MED ORDER — INSULIN GLARGINE 100 UNIT/ML ~~LOC~~ SOLN
10.0000 [IU] | Freq: Every morning | SUBCUTANEOUS | Status: DC
  Administered 2020-01-02 – 2020-01-05 (×4): 10 [IU] via SUBCUTANEOUS
  Filled 2020-01-02 (×8): qty 0.1

## 2020-01-02 MED ORDER — LATANOPROST 0.005 % OP SOLN
1.0000 [drp] | Freq: Every day | OPHTHALMIC | Status: DC
  Administered 2020-01-02 – 2020-01-10 (×5): 1 [drp] via OPHTHALMIC
  Filled 2020-01-02 (×3): qty 2.5

## 2020-01-02 MED ORDER — METOPROLOL TARTRATE 25 MG PO TABS
12.5000 mg | ORAL_TABLET | Freq: Two times a day (BID) | ORAL | Status: DC
  Administered 2020-01-02: 12.5 mg via ORAL
  Filled 2020-01-02: qty 1

## 2020-01-02 MED ORDER — SODIUM CHLORIDE 0.9 % IV SOLN: 250.0000 mL | INTRAVENOUS | Status: DC | PRN

## 2020-01-02 MED ORDER — ASPIRIN EC 81 MG PO TBEC
81.0000 mg | DELAYED_RELEASE_TABLET | Freq: Every day | ORAL | Status: DC
  Administered 2020-01-03 – 2020-01-06 (×4): 81 mg via ORAL
  Filled 2020-01-02 (×5): qty 1

## 2020-01-02 MED ORDER — SODIUM CHLORIDE 0.9% FLUSH: 3.0000 mL | INTRAVENOUS | Status: DC | PRN

## 2020-01-02 MED ORDER — MOMETASONE FURO-FORMOTEROL FUM 200-5 MCG/ACT IN AERO
2.0000 | INHALATION_SPRAY | Freq: Two times a day (BID) | RESPIRATORY_TRACT | Status: DC
  Administered 2020-01-02 – 2020-01-09 (×12): 2 via RESPIRATORY_TRACT
  Filled 2020-01-02 (×3): qty 8.8

## 2020-01-02 MED ORDER — SODIUM CHLORIDE 0.9% FLUSH
3.0000 mL | Freq: Two times a day (BID) | INTRAVENOUS | Status: DC
  Administered 2020-01-02 – 2020-01-06 (×8): 3 mL via INTRAVENOUS

## 2020-01-02 MED ORDER — ONDANSETRON HCL 4 MG/2ML IJ SOLN: 4.0000 mg | Freq: Four times a day (QID) | INTRAMUSCULAR | Status: DC | PRN

## 2020-01-02 MED ORDER — NITROGLYCERIN 2 % TD OINT
1.0000 [in_us] | TOPICAL_OINTMENT | Freq: Once | TRANSDERMAL | Status: AC
  Administered 2020-01-02: 1 [in_us] via TOPICAL
  Filled 2020-01-02: qty 1

## 2020-01-02 MED ORDER — FLUOROMETHOLONE 0.1 % OP SUSP
1.0000 [drp] | Freq: Every morning | OPHTHALMIC | Status: DC
  Administered 2020-01-02 – 2020-01-10 (×9): 1 [drp] via OPHTHALMIC
  Filled 2020-01-02 (×3): qty 5

## 2020-01-02 MED ORDER — PHYTONADIONE 5 MG PO TABS
5.0000 mg | ORAL_TABLET | Freq: Once | ORAL | Status: AC
  Administered 2020-01-02: 5 mg via ORAL
  Filled 2020-01-02 (×2): qty 1

## 2020-01-02 MED ORDER — FUROSEMIDE 10 MG/ML IJ SOLN
40.0000 mg | Freq: Once | INTRAMUSCULAR | Status: AC
  Administered 2020-01-02: 40 mg via INTRAVENOUS
  Filled 2020-01-02: qty 4

## 2020-01-02 MED ORDER — CHLORHEXIDINE GLUCONATE CLOTH 2 % EX PADS
6.0000 | MEDICATED_PAD | Freq: Every day | CUTANEOUS | Status: DC
  Administered 2020-01-02 – 2020-01-05 (×4): 6 via TOPICAL

## 2020-01-02 NOTE — Progress Notes (Addendum)
Evolving EKG changes throughout the afternoon, with developing anterolateral ST elevations. Patient denies chest pains, some SOB throughout the day. INR 2.9 on admission, given 2.5 mg of oral vit K with iniital plan for cath tomorrow. With evolving changes will discuss with interventional possibly doing tonight. 86 yo patient but independent, Cr around baseline of 1.6, INR 2.9 with repeat pending, JEHOVAHs WITNESS NO BLOOD PRODUCTS  Transfer to [facility] 2H, further plans regarding possible cath once hear back from Dr Tamala Julian.    Addendum DIscussed with Dr Tamala Julian. Too high bleeding risk given INR on admission, INR has trended up further despite vitaming k, will give another oral 5mg . Concerning for bleeding complications given INR, she is also Jehovah's witness and not accepting of transfusions. Increased risk further with advanced age and kidney function.   Redose vit K now, repeat INR in AM. If down would plan for cath tomorrow. Dr Tamala Julian on call tonight for cath lab and aware of patient. We have requested transfer to [place] at [facility].   Carlyle Dolly MD

## 2020-01-02 NOTE — Consult Note (Addendum)
Cardiology Consult    Patient ID: Cynthia James; [id number]; April 12, 1930   Admit date: 12/27/2019 Date of Consult: 01/13/2020  Primary Care Provider: Abran Richard, MD Primary Cardiologist: Dr. Gennette [facility] Cardiology  Patient Profile    Cynthia James is a 86 y.o. female with past medical history of permanent atrial fibrillation, chronic diastolic CHF, HTN, HLD, Type 2 DM, OSA (on CPAP) and Stage 3-4 CKD who is being seen today for the evaluation of NSTEMI at the request of Dr. Carles Collet.   History of Present Illness    Cynthia James is followed by [facility] and had a recent office visit on 11/28/2019. By review of notes she has a history of chronic atrial fibrillation and she failed cardioversion in the past and was evaluated by EP with continued rate control recommended. She had previously been admitted to [facility] in 10/2019 for an acute diastolic CHF exacerbation and was placed on Lasix 40 mg daily at the time of discharge with weight at 185.6 lbs. Echocardiogram that admission showed a preserved EF of 70 to 75% with no regional wall motion normalities. She did have moderate LVH and mild AS.  She presented to [facility] ED earlier this morning for evaluation of worsening dyspnea which started last night. She reports having worsening dyspnea on exertion and fatigue for the past few days. Denies any recent chest pain or palpitations. Says she could not catch her breath last night and tried to use her CPAP for relief but symptoms continued to worsen. Reports orthopnea and lower extremity edema. Weight has been stable at 184 lbs on her home scales and she denies any acute changes in this.   Initial labs show WBC 12.9, Hgb 14.3, platelets 206, Na+ 138, K+ 4.0 and creatinine 1.66 (close to baseline). BNP 649. Initial HS Troponin 3712 (previously negative in 10/2019). INR 2.9. COVID pending. CXR shows borderline cardiomegaly and mild bibasilar opacities likely consistent with  atelectasis. EKG shows atrial fibrillation with RVR, HR 113 with nonspecific ST abnormalities along the inferior leads.    Past Medical History:  Diagnosis Date   A-fib ([place])    Arthritis    Diabetes mellitus    Full dentures    High cholesterol    Hypertension    Osteoporosis    Renal disorder    Wears glasses     Past Surgical History:  Procedure Laterality Date   ABDOMINAL HYSTERECTOMY     APPENDECTOMY     CARPAL TUNNEL RELEASE  1985   rt/lt   CATARACT EXTRACTION     bilateral   EYE SURGERY     ORIF FEMUR FRACTURE  1986   lt-hip   ORIF [place]   right   TONSILLECTOMY     TRIGGER FINGER RELEASE Right 04/01/2013   Procedure: RELEASE TRIGGER FINGER/A-1 PULLEY RIGHT LONG FINGER;  Surgeon: Tennis Must, MD;  Location: [facility];  Service: Orthopedics;  Laterality: Right;     Home Medications:  Prior to Admission medications   Medication Sig Start Date End Date Taking? Authorizing Provider  acetaminophen (TYLENOL) 500 MG tablet Take 500 mg by mouth every 6 (six) hours as needed for mild pain or moderate pain.    [provider]  albuterol (PROAIR HFA) 108 (90 BASE) MCG/ACT inhaler Inhale 1-2 puffs into the lungs every 6 (six) hours as needed for wheezing or shortness of breath. 04/19/13   Virgel Manifold, MD  calcium carbonate Darron Doom)  1500 (600 Ca) MG TABS tablet Take 600 mg of elemental calcium by mouth 2 (two) times daily with a meal.     [provider]  carvedilol (COREG) 6.25 MG tablet Take 1 tablet (6.25 mg total) by mouth 2 (two) times daily with a meal. 10/27/19   Tat, Shanon Brow, MD  fluorometholone (FML) 0.1 % ophthalmic suspension Place 1 drop into both eyes every morning.  09/24/19   [provider]  furosemide (LASIX) 40 MG tablet Take 1 tablet (40 mg total) by mouth daily. 10/28/19   Orson Eva, MD  insulin glargine (LANTUS) 100 UNIT/ML injection Inject 20 Units into the skin every morning.      [provider]  latanoprost (XALATAN) 0.005 % ophthalmic solution Place 1 drop into both eyes at bedtime.  08/23/19   [provider]  Omega-3 Fatty Acids (FISH OIL PEARLS) 150 MG CAPS Take 1 capsule by mouth every morning.     [provider]  pravastatin (PRAVACHOL) 20 MG tablet Take 20 mg by mouth at bedtime.    [provider]  warfarin (COUMADIN) 4 MG tablet Take 4 mg by mouth every evening.     [provider]  Grant Ruts INHUB 250-50 MCG/DOSE AEPB Inhale 1 puff into the lungs daily as needed (for shortness of breath).  05/11/19   [provider]    Inpatient Medications: Scheduled Meds:  [START ON 01/03/2020] aspirin EC  81 mg Oral Daily   metoprolol tartrate  12.5 mg Oral BID   Continuous Infusions:  diltiazem (CARDIZEM) infusion     PRN Meds:   Allergies:    Allergies  Allergen Reactions   Codeine Nausea And Vomiting   Percocet [Oxycodone-Acetaminophen] Nausea And Vomiting   Tramadol Nausea And Vomiting   Vicodin [Hydrocodone-Acetaminophen] Nausea And Vomiting    Social History:   Social History   Socioeconomic History   Marital status: Widowed    Spouse name: Not on file   Number of children: Not on file   Years of education: Not on file   Highest education level: Not on file  Occupational History   Not on file  Tobacco Use   Smoking status: Former Smoker    Packs/day: 1.50    Years: 20.00    Pack years: 30.00    Types: Cigarettes    Quit date: 08/11/1994    Years since quitting: 25.4   Smokeless tobacco: Never Used  Vaping Use   Vaping Use: Never used  Substance and Sexual Activity   Alcohol use: No   Drug use: No   Sexual activity: Not Currently    Birth control/protection: Surgical  Other Topics Concern   Not on file  Social History Narrative   Not on file   Social Determinants of Health   Financial Resource Strain:    Difficulty of Paying Living Expenses:   Food  Insecurity:    Worried About Charity fundraiser in the Last Year:    Arboriculturist in the Last Year:   Transportation Needs:    Film/video editor (Medical):    Lack of Transportation (Non-Medical):   Physical Activity:    Days of Exercise per Week:    Minutes of Exercise per Session:   Stress:    Feeling of Stress :   Social Connections:    Frequency of Communication with Friends and Family:    Frequency of Social Gatherings with Friends and Family:    Attends Religious Services:  Active Member of Clubs or Organizations:    Attends Music therapist:    Marital Status:   Intimate Partner Violence:    Fear of Current or Ex-Partner:    Emotionally Abused:    Physically Abused:    Sexually Abused:      Family History:    Family History  Problem Relation Age of Onset   Diabetes Mother    Heart failure Father    Diabetes Brother    Cancer Brother       Review of Systems    General:  No chills, fever, night sweats or weight changes.  Cardiovascular:  No chest pain, palpitations, paroxysmal nocturnal dyspnea. Positive for dyspnea on exertion, orthopnea and edema.  Dermatological: No rash, lesions/masses Respiratory: No cough, Positive for dyspnea. Urologic: No hematuria, dysuria Abdominal:   No nausea, vomiting, diarrhea, bright red blood per rectum, melena, or hematemesis Neurologic:  No visual changes, wkns, changes in mental status. All other systems reviewed and are otherwise negative except as noted above.  Physical Exam/Data    Vitals:   12/23/2019 0458 12/23/2019 0459  BP:  109/60  Pulse:  (!) 112  Resp:  (!) 22  Temp:  98 F (36.7 C)  TempSrc:  Oral  SpO2:  100%  Weight: 83.5 kg   Height: 5' (1.524 m)    No intake or output data in the 24 hours ending 12/22/2019 0849 Filed Weights   12/28/2019 0458  Weight: 83.5 kg   Body mass index is 35.94 kg/m.   General: Pleasant elderly female appearing in NAD Psych: Normal  affect. Neuro: Alert and oriented X 3. Moves all extremities spontaneously. HEENT: Normal  Neck: Supple without bruits. JVD at 9 cm. Lungs:  Resp regular and unlabored, decreased along bases bilaterally. Heart: Irregularly irregular. no s3, s4, 2/6 SEM along RUSB. Abdomen: Soft, non-tender, non-distended, BS + x 4.  Extremities: No clubbing or cyanosis. 1+ pitting edema bilaterally. DP/PT/Radials 2+ and equal bilaterally.   EKG:  The EKG was personally reviewed and demonstrates: Atrial fibrillation with RVR, HR 113 with nonspecific ST abnormalities along the inferior leads.   Telemetry:  Telemetry was personally reviewed and demonstrates: Atrial fibrillation, HR in 110's to 120's.    Labs/Studies     Relevant CV Studies:  Echocardiogram: 10/2019 IMPRESSIONS    1. Left ventricular ejection fraction, by estimation, is 70 to 75%. The  left ventricle has hyperdynamic function. The left ventricle has no  regional wall motion abnormalities. There is moderate left ventricular  hypertrophy. Left ventricular diastolic  parameters are indeterminate.  2. Right ventricular systolic function is normal. The right ventricular  size is normal. There is mildly elevated pulmonary artery systolic  pressure.  3. The mitral valve is abnormal. Trivial mitral valve regurgitation.  4. AV is thickened, calcified Significant calcification on noncoronary  cusp. Peak and mean gradients through the valve are 14 and 7 mm Hg  consistent with mild AS. Would recomm follow up echo in 6 to 12 months.  The aortic valve is tricuspid. Aortic valve  regurgitation is not visualized.  5. The inferior vena cava is normal in size with <50% respiratory  variability, suggesting right atrial pressure of 8 mmHg.   Laboratory Data:  Chemistry Recent Labs  Lab 12/22/2019 0537  NA 138  K 4.0  CL 97*  CO2 28  GLUCOSE 216*  BUN 28*  CREATININE 1.66*  CALCIUM 9.3  GFRNONAA 27*  GFRAA 32*  ANIONGAP 13    No  results for input(s): PROT, ALBUMIN, AST, ALT, ALKPHOS, BILITOT in the last 168 hours. Hematology Recent Labs  Lab 01/03/2020 0537  WBC 12.9*  RBC 4.77  HGB 14.3  HCT 46.9*  MCV 98.3  MCH 30.0  MCHC 30.5  RDW 13.4  PLT 206   Cardiac EnzymesNo results for input(s): TROPONINI in the last 168 hours. No results for input(s): TROPIPOC in the last 168 hours.  BNP Recent Labs  Lab 12/30/2019 0537  BNP 649.0*    DDimer No results for input(s): DDIMER in the last 168 hours.  Radiology/Studies:  DG Chest Port 1 View  Result Date: 01/15/2020 CLINICAL DATA:  Shortness of breath. EXAM: PORTABLE CHEST 1 VIEW COMPARISON:  One-view chest x-ray 10/23/2019 FINDINGS: Heart is mildly enlarged, exaggerated by low lung volumes. Mild bibasilar opacities are noted. Vascular calcifications are present at the aortic arch. Degenerative changes are noted in the shoulders. IMPRESSION: 1. Borderline cardiomegaly without failure. 2. Mild bibasilar opacities likely reflect atelectasis. Electronically Signed   By: San Morelle M.D.   On: 12/27/2019 07:07     Assessment & Plan    1. NSTEMI - She presents with a 2-day history of progressive dyspnea on exertion and fatigue, found to have an NSTEMI with initial HS Troponin at 3712. Repeat HS Troponin pending. EKG shows atrial fibrillation with RVR and nonspecific ST abnormalities along the inferior leads. Will ask for a repeat 12-Lead EKG for comparison. An echocardiogram is pending to assess LV function and wall motion.  - Discussed a cardiac catheterization with the patient and she agrees to proceed. No known history of CAD prior to admission. Given her INR of 2.9, this cannot be performed today. Will start Heparin once INR less than 2.0. If INR allows, can hopefully arrange for [facility] tomorrow. Will perform echo today as would not do an LV gram given her underlying CKD.  - Continue ASA 81mg  daily and Atorvastatin 80mg  daily. Will restart BB but use Lopressor  instead of Coreg for now given her soft BP.   2. Atrial Fibrillation with RVR - In the setting of permanent atrial fibrillation. IV Cardizem has been ordered by the admitting team. She was on Coreg 6.25mg  BID prior to admission but will switch to Lopressor for now given her soft BP.  - Coumadin held in the setting of likely cardiac catheterization later this admission. INR 2.9 this AM. Start Heparin once INR < 2.0.  3. Acute on Chronic Diastolic CHF Exacerbation - She reports worsening dyspnea on exertion, orthopnea and PND but weight has been stable at 184 lbs on her home scales. BNP elevated to 649. She just received IV Lasix 40mg  this AM. Follow I&O's along with daily weights. - Repeat echocardiogram is pending to assess for any interval changes since her prior study in 10/2019 given her NSTEMI.   4. HLD - She is on Pravastatin 20mg  daily as an outpatient. Repeat FLP pending. Already switched to Atorvastatin 80mg  daily by the admitting team.   5. Stage 3-4 CKD - Creatinine 1.66 this AM which is close to her baseline. Continue to follow closely while receiving IV Lasix.   6. OSA - Continue with CPAP during admission.   For questions or updates, please contact [place] Please consult [email or web address] for contact info under Cardiology/STEMI.  Signed, Erma Heritage, PA-C 12/28/2019, 8:49 AM Pager: [phone]  Attending note Patient seen and discussed with PA Ahmed Prima, I agree with her documentation. History of chronic afib, HTN,HL,DM2, CKD IV, COPD followed by [facility]  cardiology. Presents with SOB and chest pain.     K 4 Cr 1.66 BUN 28 BNP 649 WBC 12.9 Hgb 14.3 Plt 206 INR 2.9  COVID neg hstrop 3712-->4700--> CXR borderline cardiomegaly EKG afib, inferior Qwaves  Patient presents with NSTEMI. INR 2.9 which would delay cath for today. Being Thursday would look to try to get her done before the weekend given degree of trop elecation, would give oral vitamin K 2.5mg . No hep  since therapeutic INR. Continue ASA 81, atorva 80, lopressor 12.5mg  bid. No ACE/ARB given renal dysfunction. Echo shows apex akinetic, mid to distal entire venricle is hypokinetic.Pattern could suggest stress induced CM, certaintly ischemic CM will need to be excluded. Received IV lasix 40mg , follow output.   History of CKD. Cr a little deceptive given age, her GFR is 23. Apepars near her baseline. Increased risk for contrast nephropathy. We discussed these risks in details and she understands and accepts them.   History of chronic afib, presented with afib with RVR. With systolic dysfucntion stop IV diltiazem, would use I amio for rate control and oral beta blocker, particularly with soft bp's as well. Hold coumadin for cath.   Spoke with daughter Jenny Reichmann in detail. She reports her mother is independent at baseline, independent in ADLs, no significantcognitive decline at home at her age  Beverlyn Roux, NO BLOOD PRODUCTS  Carlyle Dolly MD

## 2020-01-02 NOTE — Progress Notes (Signed)
*  PRELIMINARY RESULTS* Echocardiogram 2D Echocardiogram has been performed.  Leavy Cella 01/06/2020, 9:47 AM

## 2020-01-02 NOTE — H&P (Signed)
History and Physical  COUNTESS BIEBEL LKG:[id number] DOB: [birth year] DOA: 01/20/2020   PCP: Abran Richard, MD   Patient coming from: Home  Chief Complaint: sob, cp  HPI:  Cynthia James is a 86 y.o. female with medical history of diastolic CHF, chronic atrial fibrillation on warfarin, hypertension, hyperlipidemia, CKD stage IIIb, diabetes mellitus, COPD, morbid obesity presenting with 2-day history of chest discomfort and shortness of breath.  The patient states that she began feeling poorly on 12/31/2019 when she began having some substernal chest pressure that was worsened with exertion.  She was not able to clarify how long it lasted, but stated that " it was all day".  The patient subsequently developed shortness of breath on 01/01/2020.  It progressively worsened throughout the day.  She had orthopnea type symptoms and dyspnea on exertion.  She tried to wear her CPAP, but could not tolerate it because of her continued shortness of breath.  As result, she presented for further evaluation.  The patient stated that she had continued to have substernal chest pressure on the evening of 01/01/2020.  She denied any fevers, chills, cough, hemoptysis, nausea, vomiting, diarrhea, headache, neck pain, rashes.  There is no dysuria, hematuria, hematochezia, melena.  At the time of my evaluation, the patient does not have any chest discomfort. Notably, the patient had a recent admission to the hospital from 10/23/2019 to 10/27/2019 for respiratory failure secondary to diastolic CHF.  She was discharged home with furosemide 40 mg p.o. daily.  Her discharge weight was 185.6 pounds.  She endorses compliance with all her medications.  She stated that her last weight was on 01/01/2020 at home which was 184 pounds. In the emergency department, the patient was afebrile hemodynamically stable with oxygen saturation 100% on 3 L nasal cannula.  BMP showed a serum creatinine of 1.66 which is near her baseline.  WBC  12.9, hemoglobin 14.3, platelets 206,000.  INR was 2.9.  Chest x-ray showed pulmonary vascular congestion.  Initial troponin was 3712.  BNP was 649.  CXR low volume with increased interstitial markings. EKG showed atrial fibrillation with minimal ST elevations, 2, 3, aVF.  I personally consulted cardiology to assist with management.  Assessment/Plan: NSTEMI -Spoke with cardiology--they will see first this am -IV heparin when INR <2 -Echocardiogram -Continue aspirin -Check lipid panel -Start Lipitor 80 mg daily -Continue carvedilol  Chronic atrial fibrillation with RVR -Start diltiazem drip -Titrate as blood pressure allows  Acute on Chronic diastolic CHF -10/26/4401 echo EF 70-95%, trivial MR, mild AS -Continue furosemide IV -Continue carvedilol  Diabetes mellitus type 2 -10/23/2019 hemoglobin A1c 7.5 -Start reduced dose Lantus -NovoLog sliding scale  CKD stage IIIb -Baseline creatinine 1.3-1.6 -A.m. BMP  Hyperlipidemia -Start a atorvastatin as discussed above  Leukocytosis -Likely stress demargination -Check UA and urine culture  COPD -Continue Dulera -No wheezing on examination  Morbid obesity -BMI 35.94 -Lifestyle modification       Past Medical History:  Diagnosis Date  . A-fib ([place])   . Arthritis   . Diabetes mellitus   . Full dentures   . High cholesterol   . Hypertension   . Osteoporosis   . Renal disorder   . Wears glasses    Past Surgical History:  Procedure Laterality Date  . ABDOMINAL HYSTERECTOMY    . APPENDECTOMY    . [place]   rt/lt  . CATARACT EXTRACTION     bilateral  . EYE SURGERY    .  ORIF FEMUR FRACTURE  1986   lt-hip  . ORIF [place]   right  . TONSILLECTOMY    . TRIGGER FINGER RELEASE Right 04/01/2013   Procedure: RELEASE TRIGGER FINGER/A-1 PULLEY RIGHT LONG FINGER;  Surgeon: Tennis Must, MD;  Location: [facility];  Service: Orthopedics;  Laterality: Right;    Social History:  reports that she quit smoking about 25 years ago. Her smoking use included cigarettes. She has a 30.00 pack-year smoking history. She has never used smokeless tobacco. She reports that she does not drink alcohol and does not use drugs.   Family History  Problem Relation Age of Onset  . Diabetes Mother   . Heart failure Father   . Diabetes Brother   . Cancer Brother      Allergies  Allergen Reactions  . Codeine Nausea And Vomiting  . Percocet [Oxycodone-Acetaminophen] Nausea And Vomiting  . Tramadol Nausea And Vomiting  . Vicodin [Hydrocodone-Acetaminophen] Nausea And Vomiting     Prior to Admission medications   Medication Sig Start Date End Date Taking? Authorizing Provider  acetaminophen (TYLENOL) 500 MG tablet Take 500 mg by mouth every 6 (six) hours as needed for mild pain or moderate pain.    [provider]  albuterol (PROAIR HFA) 108 (90 BASE) MCG/ACT inhaler Inhale 1-2 puffs into the lungs every 6 (six) hours as needed for wheezing or shortness of breath. 04/19/13   Virgel Manifold, MD  calcium carbonate (OSCAL) 1500 (600 Ca) MG TABS tablet Take 600 mg of elemental calcium by mouth 2 (two) times daily with a meal.     [provider]  carvedilol (COREG) 6.25 MG tablet Take 1 tablet (6.25 mg total) by mouth 2 (two) times daily with a meal. 10/27/19   Larin Weissberg, Shanon Brow, MD  fluorometholone (FML) 0.1 % ophthalmic suspension Place 1 drop into both eyes every morning.  09/24/19   [provider]  furosemide (LASIX) 40 MG tablet Take 1 tablet (40 mg total) by mouth daily. 10/28/19   Orson Eva, MD  insulin glargine (LANTUS) 100 UNIT/ML injection Inject 20 Units into the skin every morning.     [provider]  latanoprost (XALATAN) 0.005 % ophthalmic solution Place 1 drop into both eyes at bedtime.  08/23/19   [provider]  Omega-3 Fatty Acids (FISH OIL PEARLS) 150 MG CAPS Take 1 capsule by mouth every morning.     [provider]  pravastatin (PRAVACHOL) 20 MG tablet Take 20 mg by mouth at bedtime.    [provider]  warfarin (COUMADIN) 4 MG tablet Take 4 mg by mouth every evening.     [provider]  Grant Ruts INHUB 250-50 MCG/DOSE AEPB Inhale 1 puff into the lungs daily as needed (for shortness of breath).  05/11/19   [provider]    Review of Systems:  Constitutional:  No weight loss, night sweats, Fevers, chills, fatigue.  Head&Eyes: No headache.  No vision loss.  No eye pain or scotoma ENT:  No Difficulty swallowing,Tooth/dental problems,Sore throat,  No ear ache, post nasal drip,  Cardio-vascular:  No  PND, swelling in lower extremities,  dizziness, palpitations  GI:  No  abdominal pain, nausea, vomiting, diarrhea, loss of appetite, hematochezia, melena, heartburn, indigestion, Resp:  . No cough. No coughing up of blood .No wheezing.No chest wall deformity  Skin:  no rash or lesions.  GU:  no dysuria, change in color of urine, no urgency or frequency. No  flank pain.  Musculoskeletal:  No joint pain or swelling. No decreased range of motion. No back pain.  Psych:  No change in mood or affect. No depression or anxiety. Neurologic: No headache, no dysesthesia, no focal weakness, no vision loss. No syncope  Physical Exam: Vitals:   01/01/2020 0458 01/14/2020 0459  BP:  109/60  Pulse:  (!) 112  Resp:  (!) 22  Temp:  98 F (36.7 C)  TempSrc:  Oral  SpO2:  100%  Weight: 83.5 kg   Height: 5' (1.524 m)    General:  A&O x 3, NAD, nontoxic, pleasant/cooperative Head/Eye: No conjunctival hemorrhage, no icterus, [place]ck/AT, No nystagmus ENT:  No icterus,  No thrush, good dentition, no pharyngeal exudate Neck:  No masses, no lymphadenpathy, no bruits CV:  RRR, no rub, no gallop, no S3 Lung:  Bilateral crackles. No wheeze Abdomen: soft/NT, +BS, nondistended, no peritoneal signs Ext: No cyanosis, No rashes, No petechiae, No lymphangitis, trace LE edema Neuro:  CNII-XII intact, strength 4/5 in bilateral upper and lower extremities, no dysmetria  Labs on Admission:  Basic Metabolic Panel: Recent Labs  Lab 01/10/2020 0537  NA 138  K 4.0  CL 97*  CO2 28  GLUCOSE 216*  BUN 28*  CREATININE 1.66*  CALCIUM 9.3   Liver Function Tests: No results for input(s): AST, ALT, ALKPHOS, BILITOT, PROT, ALBUMIN in the last 168 hours. No results for input(s): LIPASE, AMYLASE in the last 168 hours. No results for input(s): AMMONIA in the last 168 hours. CBC: Recent Labs  Lab 01/20/2020 0537  WBC 12.9*  NEUTROABS 10.2*  HGB 14.3  HCT 46.9*  MCV 98.3  PLT 206   Coagulation Profile: Recent Labs  Lab 01/06/2020 0537  INR 2.9*   Cardiac Enzymes: No results for input(s): CKTOTAL, CKMB, CKMBINDEX, TROPONINI in the last 168 hours. BNP: Invalid input(s): POCBNP CBG: No results for input(s): GLUCAP in the last 168 hours. Urine analysis:    Component Value Date/Time   COLORURINE YELLOW 12/09/2014 2345   APPEARANCEUR HAZY (A) 12/09/2014 2345   LABSPEC 1.010 12/09/2014 2345   PHURINE 7.5 12/09/2014 2345   GLUCOSEU 100 (A) 12/09/2014 2345   HGBUR NEGATIVE 12/09/2014 2345   BILIRUBINUR NEGATIVE 12/09/2014 2345   KETONESUR NEGATIVE 12/09/2014 2345   PROTEINUR NEGATIVE 12/09/2014 2345   UROBILINOGEN 0.2 12/09/2014 2345   NITRITE NEGATIVE 12/09/2014 2345   LEUKOCYTESUR LARGE (A) 12/09/2014 2345   Sepsis Labs: @LABRCNTIP (procalcitonin:4,lacticidven:4) )No results found for this or any previous visit (from the past 240 hour(s)).   Radiological Exams on Admission: DG Chest Port 1 View  Result Date: 01/07/2020 CLINICAL DATA:  Shortness of breath. EXAM: PORTABLE CHEST 1 VIEW COMPARISON:  One-view chest x-ray 10/23/2019 FINDINGS: Heart is mildly enlarged, exaggerated by low lung volumes. Mild bibasilar opacities are noted. Vascular calcifications are present at the aortic arch. Degenerative changes are noted in the shoulders. IMPRESSION: 1. Borderline  cardiomegaly without failure. 2. Mild bibasilar opacities likely reflect atelectasis. Electronically Signed   By: San Morelle M.D.   On: 12/24/2019 07:07    EKG: Independently reviewed. Sinus, minimal ST elevation inferior leads    Time spent:60 minutes Code Status:   FULL Family Communication:  No Family at bedside Disposition Plan: expect 2-3 day hospitalization Consults called: cardiology  DVT Prophylaxis: IV Heparin     Orson Eva, DO  [facility] Pager [phone]  If 7PM-7AM, please contact night-coverage [email or web address] Password [facility] 01/08/2020, 7:44 AM

## 2020-01-02 NOTE — Progress Notes (Signed)
ANTICOAGULATION CONSULT NOTE - Initial Consult  Pharmacy Consult for Heparin  Indication: chest pain/ACS and atrial fibrillation  Allergies  Allergen Reactions  . Codeine Nausea And Vomiting  . Percocet [Oxycodone-Acetaminophen] Nausea And Vomiting  . Tramadol Nausea And Vomiting  . Vicodin [Hydrocodone-Acetaminophen] Nausea And Vomiting    Patient Measurements: Height: 5' (152.4 cm) Weight: 83.5 kg (184 lb) IBW/kg (Calculated) : 45.5  Vital Signs: Temp: 98 F (36.7 C) (08/12 0459) Temp Source: Oral (08/12 0459) BP: 109/60 (08/12 0459) Pulse Rate: 112 (08/12 0459)  Labs: Recent Labs    01/04/2020 0537  HGB 14.3  HCT 46.9*  PLT 206  LABPROT 29.2*  INR 2.9*  CREATININE 1.66*  TROPONINIHS 3,712*    Estimated Creatinine Clearance: 22.9 mL/min (A) (by C-G formula based on SCr of 1.66 mg/dL (H)).   Medical History: Past Medical History:  Diagnosis Date  . A-fib ([place])   . Arthritis   . Diabetes mellitus   . Full dentures   . High cholesterol   . Hypertension   . Osteoporosis   . Renal disorder   . Wears glasses     Assessment: 86 y/o F with shortness of breath, troponin is elevated, starting heparin, pt is on warfarin PTA for afib, INR is 2.9  Goal of Therapy:  Heparin level 0.3-0.7 units/ml Monitor platelets by anticoagulation protocol: Yes   Plan:  Daily INR Start heparin when INR is <2  Narda Bonds, PharmD, [facility] Pharmacist Phone: [phone]

## 2020-01-02 NOTE — Progress Notes (Signed)
Patient requesting to sign form refusing all blood products. RN provided form and educated patient accordingly. Patient signed form and RN placed on chart.

## 2020-01-02 NOTE — ED Notes (Signed)
CRITICAL VALUE ALERT  Critical Value:  Troponin 4700  Date & Time Notied:  0842 01/05/2020  Provider Notified: dr.tat  Orders Received/Actions taken: md notified

## 2020-01-02 NOTE — ED Provider Notes (Signed)
[facility] EMERGENCY DEPARTMENT Provider Note   CSN: [id number] Arrival date & time: 01/11/2020  9741   History Chief Complaint  Patient presents with  . Shortness of Breath    Cynthia James is a 86 y.o. female.  The history is provided by the patient.  Shortness of Breath She has history of hypertension, diabetes, hyperlipidemia, diastolic heart failure, atrial fibrillation anticoagulated on warfarin, chronic kidney disease and comes in because of difficulty breathing.  She states that she had some mild dyspnea through the day yesterday, but dyspnea got worse when she went to go to sleep and she was unable to put her CPAP on.  She denies chest pain, heaviness, tightness, pressure.  Dyspnea is worse with exertion and worse with laying flat.  Symptoms are similar to when she was admitted 2 months ago at which time she was diagnosed with heart failure.  She has been weighing herself at home, and there has been no recent weight gain and no change in peripheral edema.  Of note, she has been vaccinated against COVID-19.  Past Medical History:  Diagnosis Date  . A-fib ([place])   . Arthritis   . Diabetes mellitus   . Full dentures   . High cholesterol   . Hypertension   . Osteoporosis   . Renal disorder   . Wears glasses     Patient Active Problem List   Diagnosis Date Noted  . Acute diastolic CHF (congestive heart failure) ([place]) 10/24/2019  . Atrial fibrillation, chronic ([place]) 10/24/2019  . Acute respiratory failure with hypoxia ([place]) 10/24/2019  . CKD (chronic kidney disease) stage 3, GFR 30-59 ml/min 10/24/2019  . Dyspnea 10/23/2019  . COPD (chronic obstructive pulmonary disease) ([place]) 10/23/2019  . OSA on CPAP 10/23/2019  . Atrial fibrillation ([place]) 08/22/2012  . Essential hypertension, benign 08/22/2012  . Diabetes ([place]) 08/22/2012  . Pure hypercholesterolemia 08/22/2012    Past Surgical History:  Procedure Laterality Date  . ABDOMINAL HYSTERECTOMY    . APPENDECTOMY     . [place]   rt/lt  . CATARACT EXTRACTION     bilateral  . EYE SURGERY    . ORIF FEMUR FRACTURE  1986   lt-hip  . ORIF [place]   right  . TONSILLECTOMY    . TRIGGER FINGER RELEASE Right 04/01/2013   Procedure: RELEASE TRIGGER FINGER/A-1 PULLEY RIGHT LONG FINGER;  Surgeon: Tennis Must, MD;  Location: [facility];  Service: Orthopedics;  Laterality: Right;     OB History    Gravida  4   Para  3   Term  3   Preterm      AB  1   Living  3     SAB  1   TAB      Ectopic      Multiple      Live Births              Family History  Problem Relation Age of Onset  . Diabetes Mother   . Heart failure Father   . Diabetes Brother   . Cancer Brother     Social History   Tobacco Use  . Smoking status: Former Smoker    Packs/day: 1.50    Years: 20.00    Pack years: 30.00    Types: Cigarettes    Quit date: 08/11/1994    Years since quitting: 25.4  . Smokeless tobacco: Never Used  Vaping Use  .  Vaping Use: Never used  Substance Use Topics  . Alcohol use: No  . Drug use: No    Home Medications Prior to Admission medications   Medication Sig Start Date End Date Taking? Authorizing Provider  acetaminophen (TYLENOL) 500 MG tablet Take 500 mg by mouth every 6 (six) hours as needed for mild pain or moderate pain.    [provider]  albuterol (PROAIR HFA) 108 (90 BASE) MCG/ACT inhaler Inhale 1-2 puffs into the lungs every 6 (six) hours as needed for wheezing or shortness of breath. 04/19/13   Virgel Manifold, MD  calcium carbonate (OSCAL) 1500 (600 Ca) MG TABS tablet Take 600 mg of elemental calcium by mouth 2 (two) times daily with a meal.     [provider]  carvedilol (COREG) 6.25 MG tablet Take 1 tablet (6.25 mg total) by mouth 2 (two) times daily with a meal. 10/27/19   Tat, Shanon Brow, MD  fluorometholone (FML) 0.1 % ophthalmic suspension Place 1 drop into both eyes every morning.  09/24/19    [provider]  furosemide (LASIX) 40 MG tablet Take 1 tablet (40 mg total) by mouth daily. 10/28/19   Orson Eva, MD  insulin glargine (LANTUS) 100 UNIT/ML injection Inject 20 Units into the skin every morning.     [provider]  latanoprost (XALATAN) 0.005 % ophthalmic solution Place 1 drop into both eyes at bedtime.  08/23/19   [provider]  Omega-3 Fatty Acids (FISH OIL PEARLS) 150 MG CAPS Take 1 capsule by mouth every morning.     [provider]  pravastatin (PRAVACHOL) 20 MG tablet Take 20 mg by mouth at bedtime.    [provider]  warfarin (COUMADIN) 4 MG tablet Take 4 mg by mouth every evening.     [provider]  Grant Ruts INHUB 250-50 MCG/DOSE AEPB Inhale 1 puff into the lungs daily as needed (for shortness of breath).  05/11/19   [provider]    Allergies    Codeine, Percocet [oxycodone-acetaminophen], Tramadol, and Vicodin [hydrocodone-acetaminophen]  Review of Systems   Review of Systems  Respiratory: Positive for shortness of breath.   All other systems reviewed and are negative.   Physical Exam Updated Vital Signs There were no vitals taken for this visit.  Physical Exam Vitals and nursing note reviewed.   86 year old female, resting comfortably and in no acute distress. Vital signs are significant for elevated heart rate and respiratory rate. Oxygen saturation is 100%, which is normal. Head is normocephalic and atraumatic. PERRLA, EOMI. Oropharynx is clear. Neck is nontender and supple without adenopathy or JVD. Back is nontender and there is no CVA tenderness. Lungs have bibasilar rales which are more prominent on the left.  There are no wheezes or rhonchi. Chest is nontender. Heart has regular rate and rhythm without murmur. Abdomen is soft, flat, nontender without masses or hepatosplenomegaly and peristalsis is normoactive. Extremities have 1+ edema, full range of motion is present. Skin is warm  and dry without rash. Neurologic: Mental status is normal, cranial nerves are intact, there are no motor or sensory deficits.  ED Results / Procedures / Treatments   Labs (all labs ordered are listed, but only abnormal results are displayed) Labs Reviewed  BASIC METABOLIC PANEL - Abnormal; Notable for the following components:      Result Value   Chloride 97 (*)    Glucose, Bld 216 (*)    BUN 28 (*)    Creatinine, Ser 1.66 (*)  GFR calc non Af Amer 27 (*)    GFR calc Af Amer 32 (*)    All other components within normal limits  BRAIN NATRIURETIC PEPTIDE - Abnormal; Notable for the following components:   B Natriuretic Peptide 649.0 (*)    All other components within normal limits  CBC WITH DIFFERENTIAL/PLATELET - Abnormal; Notable for the following components:   WBC 12.9 (*)    HCT 46.9 (*)    Neutro Abs 10.2 (*)    Monocytes Absolute 1.1 (*)    All other components within normal limits  PROTIME-INR - Abnormal; Notable for the following components:   Prothrombin Time 29.2 (*)    INR 2.9 (*)    All other components within normal limits  TROPONIN I (HIGH SENSITIVITY) - Abnormal; Notable for the following components:   Troponin I (High Sensitivity) 3,712 (*)    All other components within normal limits  SARS CORONAVIRUS 2 BY RT PCR (HOSPITAL ORDER, [facility])  TROPONIN I (HIGH SENSITIVITY)    EKG EKG Interpretation  Date/Time:  Thursday January 02 2020 05:15:39 EDT Ventricular Rate:  113 PR Interval:    QRS Duration: 88 QT Interval:  340 QTC Calculation: 467 R Axis:   126 Text Interpretation: Atrial fibrillation Ventricular premature complex Right axis deviation Low voltage, precordial leads Minimal ST elevation, inferior leads When compared with ECG of 10/23/2019, No significant change was found Confirmed by [name] ([id number]) on 01/08/2020 5:23:04 AM   Radiology DG Chest Port 1 View  Result Date: 01/14/2020 CLINICAL DATA:  Shortness of  breath. EXAM: PORTABLE CHEST 1 VIEW COMPARISON:  One-view chest x-ray 10/23/2019 FINDINGS: Heart is mildly enlarged, exaggerated by low lung volumes. Mild bibasilar opacities are noted. Vascular calcifications are present at the aortic arch. Degenerative changes are noted in the shoulders. IMPRESSION: 1. Borderline cardiomegaly without failure. 2. Mild bibasilar opacities likely reflect atelectasis. Electronically Signed   By: San Morelle M.D.   On: 01/05/2020 07:07    Procedures Procedures  CRITICAL CARE Performed by: [name] Total critical care time: 50 minutes Critical care time was exclusive of separately billable procedures and treating other patients. Critical care was necessary to treat or prevent imminent or life-threatening deterioration. Critical care was time spent personally by me on the following activities: development of treatment plan with patient and/or surrogate as well as nursing, discussions with consultants, evaluation of patient's response to treatment, examination of patient, obtaining history from patient or surrogate, ordering and performing treatments and interventions, ordering and review of laboratory studies, ordering and review of radiographic studies, pulse oximetry and re-evaluation of patient's condition.  Medications Ordered in ED Medications  aspirin chewable tablet 324 mg (has no administration in time range)  furosemide (LASIX) injection 40 mg (has no administration in time range)  nitroGLYCERIN (NITROGLYN) 2 % ointment 1 inch (has no administration in time range)    ED Course  I have reviewed the triage vital signs and the nursing notes.  Pertinent labs & imaging results that were available during my care of the patient were reviewed by me and considered in my medical decision making (see chart for details).  MDM Rules/Calculators/A&P Dyspnea which appears to be heart failure exacerbation based on peripheral edema and rales.  No wheezes to  suspect COPD exacerbation.  No cough to suspect pneumonia.  Old records are reviewed confirming hospitalization in early June for CHF exacerbation.  Will check chest x-ray, screening labs, ECG.  ECG shows no acute changes.  Chest x-ray shows cardiomegaly and no overt heart failure.  BNP has come back significantly elevated at 649, and this is higher than it was during her hospital admission and June.  Troponin is come back markedly elevated at 3712 indicating patient has had a non-STEMI.  Creatinine is mildly elevated and actually improved over baseline.  She is given aspirin, furosemide, topical nitrates.  Pharmacy has been consulted for heparin management, but currently patient has therapeutic INR and heparin will not be started immediately.  Case is discussed with Dr. Carles Collet of [facility], who agrees to admit the patient.  She will need cardiology consultation.  Final Clinical Impression(s) / ED Diagnoses Final diagnoses:  Non-STEMI (non-ST elevated myocardial infarction) ([place])  Acute on chronic diastolic heart failure ([facility])  Anticoagulated on warfarin  Chronic atrial fibrillation ([place])  Renal insufficiency    Rx / DC Orders ED Discharge Orders    None       [name], MD 27/74/12 [phone]

## 2020-01-02 NOTE — ED Triage Notes (Signed)
Pt from home sob since 8 pm. Was using home cpap. Decreased sounds in lower. 4l [place] . Tachycardic with ems hx a fib, htn , chf, copd, dm.  Here in June for the same  20g r ac.

## 2020-01-02 NOTE — Progress Notes (Signed)
**Note De-identified Cynthia James Obfuscation** EKG complete and placed in patient chart 

## 2020-01-02 NOTE — ED Notes (Signed)
CRITICAL VALUE ALERT  Critical Value:  Troponin 3712  Date & Time Notied:  01/08/2020 @0646   Provider Notified: Dr. Roxanne Mins  Orders Received/Actions taken: orders placed

## 2020-01-02 NOTE — TOC Initial Note (Signed)
Transition of Care [facility]) - Initial/Assessment Note   Patient Details  Name: Cynthia James MRN: [id number] Date of Birth: Feb 04, 1930  Transition of Care [facility]) CM/SW Contact:    Sherie Don, LCSW Phone Number: 01/03/2020, 10:19 AM  Clinical Narrative: Patient is an 86 year old female who was admitted for hypertension, atrial fibrillation, chronic kidney disease, NSTEMI, and acute on chronic diastolic CHF. TOC received consult for CHF screening. CSW met with patient in ED. Per patient, she lives with her daughter and was recently discharged from [facility] with [facility]. Patient reported she has a CPAP and walker at home and is planning to order a raised toilet seat or 3-N-1 through her insurance. Patient reported she is currently independent with her ADLs, but has had 2 falls at home since her discharge from the hospital in June. Patient reported she can afford her medications each month, which she has mailed to her.  Per CHF screening, patient reported she does not follow a heart healthy diet, but does not use added salt in her food. Patient reported she has been trying to restrict her fluid intake and has completed daily weights since her recent discharge. TOC to follow for possible discharge needs.  Expected Discharge Plan: Home/Self Care Barriers to Discharge: Continued Medical Work up  Patient Goals and CMS Choice Patient states their goals for this hospitalization and ongoing recovery are:: Discharge home CMS Medicare.gov Compare Post Acute Care list provided to:: Patient Choice offered to / list presented to : Patient  Expected Discharge Plan and Services Expected Discharge Plan: Home/Self Care In-house Referral: Clinical Social Work Discharge Planning Services: NA Living arrangements for the past 2 months: Mobile Home  Prior Living Arrangements/Services Living arrangements for the past 2 months: Mobile Home Lives with:: Adult Children Patient language and need for interpreter  reviewed:: Yes Do you feel safe going back to the place where you live?: Yes      Need for Family Participation in Patient Care: No (Comment) Care giver support system in place?: Yes (comment) (Lucinda Angler (daughter)) Current home services: DME (CPAP, walker) Criminal Activity/Legal Involvement Pertinent to Current Situation/Hospitalization: No - Comment as needed  Emotional Assessment Appearance:: Appears stated age Attitude/Demeanor/Rapport: Engaged Affect (typically observed): Accepting Orientation: : Oriented to Self, Oriented to Place, Oriented to  Time, Oriented to Situation Alcohol / Substance Use: Not Applicable Psych Involvement: No (comment)  Admission diagnosis:  NSTEMI (non-ST elevated myocardial infarction) [facility] [I21.4] Patient Active Problem List   Diagnosis Date Noted   NSTEMI (non-ST elevated myocardial infarction) ([place]) 01/18/2020   Acute on chronic diastolic CHF (congestive heart failure) ([place]) 12/24/2019   Anticoagulated on warfarin    Acute diastolic CHF (congestive heart failure) ([place]) 10/24/2019   Atrial fibrillation, chronic ([place]) 10/24/2019   Acute respiratory failure with hypoxia ([place]) 10/24/2019   CKD (chronic kidney disease) stage 3, GFR 30-59 ml/min 10/24/2019   Dyspnea 10/23/2019   COPD (chronic obstructive pulmonary disease) ([place]) 10/23/2019   OSA on CPAP 10/23/2019   Atrial fibrillation ([place]) 08/22/2012   Essential hypertension, benign 08/22/2012   Diabetes ([place]) 08/22/2012   Pure hypercholesterolemia 08/22/2012   PCP:  Abran Richard, MD Pharmacy:   [place], [place] - [address] [place] [address] Phone: [phone] Fax: [facility] [place], [place] [address] Phone: [phone] Fax: [phone]  Readmission Risk Interventions No flowsheet data found.

## 2020-01-03 LAB — BASIC METABOLIC PANEL
Anion gap: 10 (ref 5–15)
BUN: 39 mg/dL — ABNORMAL HIGH (ref 8–23)
CO2: 30 mmol/L (ref 22–32)
Calcium: 8.8 mg/dL — ABNORMAL LOW (ref 8.9–10.3)
Chloride: 97 mmol/L — ABNORMAL LOW (ref 98–111)
Creatinine, Ser: 2.23 mg/dL — ABNORMAL HIGH (ref 0.44–1.00)
GFR calc Af Amer: 22 mL/min — ABNORMAL LOW (ref >60)
GFR calc non Af Amer: 19 mL/min — ABNORMAL LOW (ref >60)
Glucose, Bld: 227 mg/dL — ABNORMAL HIGH (ref 70–99)
Potassium: 4.1 mmol/L (ref 3.5–5.1)
Sodium: 137 mmol/L (ref 135–145)

## 2020-01-03 LAB — HEMOGLOBIN A1C
Hgb A1c MFr Bld: 7.7 % — ABNORMAL HIGH (ref 4.8–5.6)
Mean Plasma Glucose: 174.29 mg/dL

## 2020-01-03 LAB — HEPATIC FUNCTION PANEL
ALT: 22 U/L (ref 0–44)
AST: 35 U/L (ref 15–41)
Albumin: 3.1 g/dL — ABNORMAL LOW (ref 3.5–5.0)
Alkaline Phosphatase: 62 U/L (ref 38–126)
Bilirubin, Direct: 0.2 mg/dL (ref 0.0–0.2)
Indirect Bilirubin: 0.4 mg/dL (ref 0.3–0.9)
Total Bilirubin: 0.6 mg/dL (ref 0.3–1.2)
Total Protein: 6 g/dL — ABNORMAL LOW (ref 6.5–8.1)

## 2020-01-03 LAB — GLUCOSE, CAPILLARY
Glucose-Capillary: 128 mg/dL — ABNORMAL HIGH (ref 70–99)
Glucose-Capillary: 148 mg/dL — ABNORMAL HIGH (ref 70–99)
Glucose-Capillary: 148 mg/dL — ABNORMAL HIGH (ref 70–99)
Glucose-Capillary: 169 mg/dL — ABNORMAL HIGH (ref 70–99)
Glucose-Capillary: 90 mg/dL (ref 70–99)

## 2020-01-03 LAB — URINE CULTURE

## 2020-01-03 LAB — MRSA PCR SCREENING: MRSA by PCR: NEGATIVE

## 2020-01-03 LAB — TROPONIN I (HIGH SENSITIVITY): Troponin I (High Sensitivity): 1581 ng/L (ref <18)

## 2020-01-03 LAB — PROTIME-INR
INR: 2.8 — ABNORMAL HIGH (ref 0.8–1.2)
Prothrombin Time: 28.6 seconds — ABNORMAL HIGH (ref 11.4–15.2)

## 2020-01-03 MED ORDER — PHYTONADIONE 5 MG PO TABS
10.0000 mg | ORAL_TABLET | Freq: Once | ORAL | Status: AC
  Administered 2020-01-03: 10 mg via ORAL
  Filled 2020-01-03 (×3): qty 2

## 2020-01-03 MED ORDER — AMIODARONE HCL IN DEXTROSE 360-4.14 MG/200ML-% IV SOLN
60.0000 mg/h | INTRAVENOUS | Status: AC
  Administered 2020-01-03 (×2): 60 mg/h via INTRAVENOUS

## 2020-01-03 MED ORDER — INSULIN ASPART 100 UNIT/ML ~~LOC~~ SOLN
0.0000 [IU] | SUBCUTANEOUS | Status: DC
  Administered 2020-01-03 (×2): 1 [IU] via SUBCUTANEOUS
  Administered 2020-01-03 – 2020-01-04 (×2): 2 [IU] via SUBCUTANEOUS
  Administered 2020-01-04: 3 [IU] via SUBCUTANEOUS
  Administered 2020-01-04: 2 [IU] via SUBCUTANEOUS
  Administered 2020-01-04 (×2): 1 [IU] via SUBCUTANEOUS
  Administered 2020-01-05: 7 [IU] via SUBCUTANEOUS
  Administered 2020-01-05: 2 [IU] via SUBCUTANEOUS
  Administered 2020-01-05 (×2): 3 [IU] via SUBCUTANEOUS
  Administered 2020-01-05: 5 [IU] via SUBCUTANEOUS
  Administered 2020-01-06: 2 [IU] via SUBCUTANEOUS

## 2020-01-03 MED ORDER — AMIODARONE HCL IN DEXTROSE 360-4.14 MG/200ML-% IV SOLN
30.0000 mg/h | INTRAVENOUS | Status: DC
  Administered 2020-01-03 – 2020-01-06 (×5): 30 mg/h via INTRAVENOUS
  Filled 2020-01-03 (×6): qty 200

## 2020-01-03 NOTE — Progress Notes (Signed)
Paged [facility] to notify of patient arrival to 2H06. No cardiac catheterization today. Okay to order diet.

## 2020-01-03 NOTE — Progress Notes (Signed)
**Note De-identified Cynthia James Obfuscation** EKG complete and placed in patient chart 

## 2020-01-03 NOTE — Progress Notes (Signed)
Progress Note  Patient Name: Cynthia James Date of Encounter: 01/03/2020  [facility] Cardiologist: New  Subjective   No chest pain. Stable SOB  Inpatient Medications    Scheduled Meds: . aspirin EC  81 mg Oral Daily  . atorvastatin  80 mg Oral Daily  . Chlorhexidine Gluconate Cloth  6 each Topical Daily  . fluorometholone  1 drop Both Eyes q morning - 10a  . insulin aspart  0-9 Units Subcutaneous Q4H  . insulin glargine  10 Units Subcutaneous q morning - 10a  . latanoprost  1 drop Both Eyes QHS  . mometasone-formoterol  2 puff Inhalation BID  . phytonadione  10 mg Oral Once  . sodium chloride flush  3 mL Intravenous Q12H   Continuous Infusions: . sodium chloride     PRN Meds: sodium chloride, acetaminophen, ondansetron (ZOFRAN) IV, sodium chloride flush   Vital Signs    Vitals:   01/03/20 0400 01/03/20 0403 01/03/20 0500 01/03/20 0818  BP: (!) 70/45 (!) 97/44 (!) 114/54   Pulse: 83 83 (!) 103 (!) 106  Resp: (!) 22 (!) 21 (!) 26 (!) 31  Temp: 98 F (36.7 C)   98 F (36.7 C)  TempSrc: Oral   Oral  SpO2: 98% 99% 97% 98%  Weight:   84.9 kg   Height:        Intake/Output Summary (Last 24 hours) at 01/03/2020 0842 Last data filed at 01/07/2020 1500 Gross per 24 hour  Intake 15.25 ml  Output --  Net 15.25 ml   Last 3 Weights 01/03/2020 12/29/2019 10/26/2019  Weight (lbs) 187 lb 2.7 oz 184 lb 185 lb 10 oz  Weight (kg) 84.9 kg 83.462 kg 84.2 kg      Telemetry    afib variable rates- Personally Reviewed  ECG    n/a- Personally Reviewed  Physical Exam   GEN: No acute distress.   Neck: No JVD Cardiac: irreg, no murmurs, rubs, or gallops.  Respiratory: bilateral crackles GI: Soft, nontender, non-distended  MS: No edema; No deformity. Neuro:  Nonfocal  Psych: Normal affect   Labs    High Sensitivity Troponin:   Recent Labs  Lab 01/01/2020 0537 01/13/2020 0708 01/03/20 0258  TROPONINIHS 3,712* 4,700* 1,581*      Chemistry Recent Labs   Lab 01/18/2020 0537 01/03/20 0258  NA 138 137  K 4.0 4.1  CL 97* 97*  CO2 28 30  GLUCOSE 216* 227*  BUN 28* 39*  CREATININE 1.66* 2.23*  CALCIUM 9.3 8.8*  PROT  --  6.0*  ALBUMIN  --  3.1*  AST  --  35  ALT  --  22  ALKPHOS  --  62  BILITOT  --  0.6  GFRNONAA 27* 19*  GFRAA 32* 22*  ANIONGAP 13 10     Hematology Recent Labs  Lab 12/24/2019 0537  WBC 12.9*  RBC 4.77  HGB 14.3  HCT 46.9*  MCV 98.3  MCH 30.0  MCHC 30.5  RDW 13.4  PLT 206    BNP Recent Labs  Lab 01/14/2020 0537  BNP 649.0*     DDimer No results for input(s): DDIMER in the last 168 hours.   Radiology    DG Chest Port 1 View  Result Date: 01/06/2020 CLINICAL DATA:  Shortness of breath. EXAM: PORTABLE CHEST 1 VIEW COMPARISON:  One-view chest x-ray 10/23/2019 FINDINGS: Heart is mildly enlarged, exaggerated by low lung volumes. Mild bibasilar opacities are noted. Vascular calcifications are present at the aortic  arch. Degenerative changes are noted in the shoulders. IMPRESSION: 1. Borderline cardiomegaly without failure. 2. Mild bibasilar opacities likely reflect atelectasis. Electronically Signed   By: San Morelle M.D.   On: 01/14/2020 07:07   ECHOCARDIOGRAM COMPLETE  Result Date: 01/10/2020    ECHOCARDIOGRAM REPORT   Patient Name:   Cynthia James Date of Exam: 01/10/2020 Medical Rec #:  [id number]            Height:       60.0 in Accession #:    [id number]           Weight:       184.0 lb Date of Birth:  Aug 26, 1929            BSA:          1.802 m Patient Age:    86 years             BP:           109/60 mmHg Patient Gender: F                    HR:           112 bpm. Exam Location:  [facility] Procedure: 2D Echo Indications:    NSTEMI I21.4  History:        Patient has prior history of Echocardiogram examinations, most                 recent 10/24/2019. CHF, Previous Myocardial Infarction, COPD,                 Arrythmias:Atrial Fibrillation, Signs/Symptoms:Dyspnea; Risk                  Factors:Hypertension, Diabetes, Dyslipidemia and Former Smoker.  Sonographer:    Leavy Cella RDCS (AE) Referring Phys: [phone] DAVID TAT IMPRESSIONS  1. The entire apex is akinetic. Essentially the entire mid to distal ventricle is hypokinetic, with normal function at the base. Pattern could be consistent with stress induced cardiomyopathy/Takotsubo CM, cannot exclude ischemic heart disease. . Left ventricular ejection fraction, by estimation, is 25 to 30%. The left ventricle has severely decreased function. The left ventricle demonstrates regional wall motion abnormalities (see scoring diagram/findings for description). Left ventricular diastolic parameters are indeterminate.  2. RV poorly visualized, grossly appears enlarged with mild systolic dysfunction. . Right ventricular systolic function was not well visualized. The right ventricular size is not well visualized.  3. The mitral valve is normal in structure. Trivial mitral valve regurgitation. No evidence of mitral stenosis.  4. Tricuspid valve regurgitation is moderate.  5. The aortic valve is tricuspid. Aortic valve regurgitation is not visualized. No aortic stenosis is present.  6. Mild pulmonary HTN, PASP is 38 mmHg.  7. The inferior vena cava is normal in size with greater than 50% respiratory variability, suggesting right atrial pressure of 3 mmHg. FINDINGS  Left Ventricle: The entire apex is akinetic. Essentially the entire mid to distal ventricle is hypokinetic, with normal function at the base. Pattern could be consistent with stress induced cardiomyopathy/Takotsubo CM, cannot exclude ischemic heart disease. Left ventricular ejection fraction, by estimation, is 25 to 30%. The left ventricle has severely decreased function. The left ventricle demonstrates regional wall motion abnormalities. The left ventricular internal cavity size was normal in size. There is no left ventricular hypertrophy. Left ventricular diastolic parameters are indeterminate.  Right Ventricle: RV poorly visualized, grossly appears enlarged with mild systolic dysfunction. The right ventricular size is not well  visualized. Right vetricular wall thickness was not assessed. Right ventricular systolic function was not well visualized. The tricuspid regurgitant velocity is 2.84 m/s, and with an assumed right atrial pressure of 10 mmHg, the estimated right ventricular systolic pressure is 29.9 mmHg. Left Atrium: Left atrial size was normal in size. Right Atrium: Right atrial size was normal in size. Pericardium: There is no evidence of pericardial effusion. Mitral Valve: The mitral valve is normal in structure. There is mild thickening of the mitral valve leaflet(s). There is mild calcification of the mitral valve leaflet(s). Moderate mitral annular calcification. Trivial mitral valve regurgitation. No evidence of mitral valve stenosis. Tricuspid Valve: The tricuspid valve is not well visualized. Tricuspid valve regurgitation is moderate . No evidence of tricuspid stenosis. Aortic Valve: The aortic valve is tricuspid. . There is moderate thickening and moderate calcification of the aortic valve. Aortic valve regurgitation is not visualized. No aortic stenosis is present. Moderate aortic valve annular calcification. There is  moderate thickening of the aortic valve. There is moderate calcification of the aortic valve. Aortic valve mean gradient measures 3.3 mmHg. Aortic valve peak gradient measures 6.3 mmHg. Aortic valve area, by VTI measures 1.61 cm. Pulmonic Valve: The pulmonic valve was not well visualized. Pulmonic valve regurgitation is not visualized. No evidence of pulmonic stenosis. Aorta: The aortic root is normal in size and structure. Pulmonary Artery: Mild pulmonary HTN, PASP is 38 mmHg. Venous: The inferior vena cava is normal in size with greater than 50% respiratory variability, suggesting right atrial pressure of 3 mmHg. IAS/Shunts: No atrial level shunt detected by color flow  Doppler.  LEFT VENTRICLE PLAX 2D LVOT diam:     1.90 cm  Diastology LV SV:         34       LV e' lateral:   7.28 cm/s LV SV Index:   19       LV E/e' lateral: 11.2 LVOT Area:     2.84 cm LV e' medial:    6.46 cm/s                         LV E/e' medial:  12.7  RIGHT VENTRICLE RV S prime:     6.18 cm/s TAPSE (M-mode): 0.9 cm LEFT ATRIUM             Index       RIGHT ATRIUM           Index LA Vol (A2C):   52.2 ml 28.97 ml/m RA Area:     12.50 cm LA Vol (A4C):   47.4 ml 26.31 ml/m RA Volume:   31.40 ml  17.43 ml/m LA Biplane Vol: 50.9 ml 28.25 ml/m  AORTIC VALVE AV Area (Vmax):    1.63 cm AV Area (Vmean):   1.51 cm AV Area (VTI):     1.61 cm AV Vmax:           125.08 cm/s AV Vmean:          84.286 cm/s AV VTI:            0.208 m AV Peak Grad:      6.3 mmHg AV Mean Grad:      3.3 mmHg LVOT Vmax:         71.98 cm/s LVOT Vmean:        44.777 cm/s LVOT VTI:          0.118 m LVOT/AV VTI ratio: 0.57 MITRAL VALVE  TRICUSPID VALVE MV Area (PHT): 3.63 cm    TR Peak grad:   32.3 mmHg MV Decel Time: 209 msec    TR Vmax:        284.00 cm/s MV E velocity: 81.80 cm/s MV A velocity: 25.70 cm/s  SHUNTS MV E/A ratio:  3.18        Systemic VTI:  0.12 m                            Systemic Diam: 1.90 cm Carlyle Dolly MD Electronically signed by Carlyle Dolly MD Signature Date/Time: 12/26/2019/12:03:20 PM    Final     Cardiac Studies    Patient Profile     Cynthia James is a 86 y.o. female with past medical history of permanent atrial fibrillation, chronic diastolic CHF, HTN, HLD, Type 2 DM, OSA (on CPAP) and Stage 3-4 CKD who is being seen today for the evaluation of NSTEMI at the request of Dr. Carles Collet.   Assessment & Plan    1. NSTEMI - peak trop 4700, trending down - EKG initially nonspecific ST/T changes. Yesterday afternoon evolving changes with anterolateral ST elevations - patient without chest pain throughout course, only SOB - echo acute drop in LVEF 25-30%, apical akinesis,  essentially mid to distal ventricle hypokinetic  - discussed case with Dr Tamala Julian interventional on call yesterday afternoon. Due to elevated INR in setting of home coumadin with increased bleeding risk (patient also Jehovah's witness refusing blood products), renal dysfunction, absence of chest pain, and hemodynamic stability cath not pursued urgently yesterday  - medical therapy with ASA, statin. Soft bp's avoiding beta blocker, ACE/ARB - received total of 7.5mg  of vit K yesterday, INR remains elevated 2.8. Wrote for 10mg  this AM at 630AM but has not been given yet,nursing is contacting pharmacy to obtain ASAP - worsening Cr today to 2.2, GFR 19 - troponin trending down, repeat EKG pending this AM - transfer to [facility] pending, no beds available.   - likely continued medical management for her NSTEMI today, pending INR and renal function reassess potential for cath going forward.    2 Acute systolic HF - - echo acute drop in LVEF 25-30%, apical akinesis, essentially mid to distal ventricle hypokinetic - holding beta blocker, ACE/ARB/ARNI in setting of soft bp's and renal dysfunction - some crackles on exam. Sats high 90s on 2L [place], with AKI hold on IV diuretic for now.   3. Afib - coumadin on hold - some high heart rates, we will start amio for rate control without bolus.    For questions or updates, please contact [place] Please consult [email or web address] for contact info under        Signed, Carlyle Dolly, MD  01/03/2020, 8:42 AM

## 2020-01-03 NOTE — Progress Notes (Signed)
Patient briefly seen and evaluated this morning with no complaints or concerns noted.  She is awaiting transfer to [facility] under cardiology service for further evaluation work-up of NSTEMI.  She is still noted to have elevated INR levels with plans to administer further vitamin K this morning.  Creatinine levels are also increased compared to yesterday likely due to some blood pressure drops overnight.  IV fluid currently not indicated given volume overload status.  Further work-up per cardiology team at [facility].  Hospitalist team to sign off, please reconsult as needed.

## 2020-01-03 NOTE — Progress Notes (Signed)
VAST consulted to obtain IV access for cath lab procedure. Upon arrival to bedside, discovered plan has changed and pt will not be going for cath today. Spoke with nurse, Marcene Brawn about vein preservation as well as need for PICC/CL if patient will continue on Amiodarone for greater than 24 hrs. Marcene Brawn, RN verbalized understanding and stated she would pass along information.

## 2020-01-04 DIAGNOSIS — I213 ST elevation (STEMI) myocardial infarction of unspecified site: Secondary | ICD-10-CM

## 2020-01-04 LAB — BASIC METABOLIC PANEL
Anion gap: 14 (ref 5–15)
Anion gap: 12 (ref 5–15)
BUN: 47 mg/dL — ABNORMAL HIGH (ref 8–23)
BUN: 46 mg/dL — ABNORMAL HIGH (ref 8–23)
CO2: 25 mmol/L (ref 22–32)
CO2: 30 mmol/L (ref 22–32)
Calcium: 9 mg/dL (ref 8.9–10.3)
Calcium: 8.7 mg/dL — ABNORMAL LOW (ref 8.9–10.3)
Chloride: 100 mmol/L (ref 98–111)
Chloride: 95 mmol/L — ABNORMAL LOW (ref 98–111)
Creatinine, Ser: 2.41 mg/dL — ABNORMAL HIGH (ref 0.44–1.00)
Creatinine, Ser: 2.52 mg/dL — ABNORMAL HIGH (ref 0.44–1.00)
GFR calc Af Amer: 20 mL/min — ABNORMAL LOW (ref >60)
GFR calc Af Amer: 19 mL/min — ABNORMAL LOW (ref >60)
GFR calc non Af Amer: 17 mL/min — ABNORMAL LOW (ref >60)
GFR calc non Af Amer: 17 mL/min — ABNORMAL LOW (ref >60)
Glucose, Bld: 142 mg/dL — ABNORMAL HIGH (ref 70–99)
Glucose, Bld: 143 mg/dL — ABNORMAL HIGH (ref 70–99)
Potassium: 6.3 mmol/L (ref 3.5–5.1)
Potassium: 4.6 mmol/L (ref 3.5–5.1)
Sodium: 139 mmol/L (ref 135–145)
Sodium: 137 mmol/L (ref 135–145)

## 2020-01-04 LAB — CBC
HCT: 42.2 % (ref 36.0–46.0)
Hemoglobin: 12.5 g/dL (ref 12.0–15.0)
MCH: 29.1 pg (ref 26.0–34.0)
MCHC: 29.6 g/dL — ABNORMAL LOW (ref 30.0–36.0)
MCV: 98.4 fL (ref 80.0–100.0)
Platelets: 207 10*3/uL (ref 150–400)
RBC: 4.29 MIL/uL (ref 3.87–5.11)
RDW: 13.3 % (ref 11.5–15.5)
WBC: 9.3 10*3/uL (ref 4.0–10.5)
nRBC: 0 % (ref 0.0–0.2)

## 2020-01-04 LAB — PROTIME-INR
INR: 1.4 — ABNORMAL HIGH (ref 0.8–1.2)
Prothrombin Time: 16.3 seconds — ABNORMAL HIGH (ref 11.4–15.2)

## 2020-01-04 LAB — GLUCOSE, CAPILLARY
Glucose-Capillary: 123 mg/dL — ABNORMAL HIGH (ref 70–99)
Glucose-Capillary: 136 mg/dL — ABNORMAL HIGH (ref 70–99)
Glucose-Capillary: 186 mg/dL — ABNORMAL HIGH (ref 70–99)
Glucose-Capillary: 193 mg/dL — ABNORMAL HIGH (ref 70–99)
Glucose-Capillary: 220 mg/dL — ABNORMAL HIGH (ref 70–99)
Glucose-Capillary: 236 mg/dL — ABNORMAL HIGH (ref 70–99)

## 2020-01-04 LAB — HEPARIN LEVEL (UNFRACTIONATED): Heparin Unfractionated: 0.17 IU/mL — ABNORMAL LOW (ref 0.30–0.70)

## 2020-01-04 MED ORDER — FUROSEMIDE 10 MG/ML IJ SOLN
80.0000 mg | Freq: Two times a day (BID) | INTRAMUSCULAR | Status: DC
  Administered 2020-01-04 – 2020-01-05 (×3): 80 mg via INTRAVENOUS
  Filled 2020-01-04 (×3): qty 8

## 2020-01-04 MED ORDER — HEPARIN (PORCINE) 25000 UT/250ML-% IV SOLN
1100.0000 [IU]/h | INTRAVENOUS | Status: DC
  Administered 2020-01-04: 900 [IU]/h via INTRAVENOUS
  Administered 2020-01-05: 1100 [IU]/h via INTRAVENOUS
  Filled 2020-01-04 (×2): qty 250

## 2020-01-04 MED ORDER — MILRINONE LACTATE IN DEXTROSE 20-5 MG/100ML-% IV SOLN
0.2500 ug/kg/min | INTRAVENOUS | Status: DC
  Administered 2020-01-04 – 2020-01-05 (×4): 0.25 ug/kg/min via INTRAVENOUS
  Filled 2020-01-04 (×3): qty 100

## 2020-01-04 NOTE — Progress Notes (Signed)
Progress Note  Patient Name: Cynthia James Date of Encounter: 01/04/2020  Primary Cardiologist:   No primary care provider on file.   Subjective   She denies pain.  She is coughing but not reporting SOB on 4 liters.   Inpatient Medications    Scheduled Meds: . aspirin EC  81 mg Oral Daily  . atorvastatin  80 mg Oral Daily  . Chlorhexidine Gluconate Cloth  6 each Topical Daily  . fluorometholone  1 drop Both Eyes q morning - 10a  . insulin aspart  0-9 Units Subcutaneous Q4H  . insulin glargine  10 Units Subcutaneous q morning - 10a  . latanoprost  1 drop Both Eyes QHS  . mometasone-formoterol  2 puff Inhalation BID  . sodium chloride flush  3 mL Intravenous Q12H   Continuous Infusions: . sodium chloride    . amiodarone 30 mg/hr (01/03/20 2044)   PRN Meds: sodium chloride, acetaminophen, ondansetron (ZOFRAN) IV, sodium chloride flush   Vital Signs    Vitals:   01/04/20 0500 01/04/20 0600 01/04/20 0700 01/04/20 0754  BP: 119/67 118/65 119/83   Pulse: 98 98 98   Resp: (!) 21 (!) 24 18   Temp:      TempSrc:      SpO2: 100% 100% 99% 98%  Weight:      Height:        Intake/Output Summary (Last 24 hours) at 01/04/2020 0829 Last data filed at 01/04/2020 0700 Gross per 24 hour  Intake 412.94 ml  Output 50 ml  Net 362.94 ml   Filed Weights   01/18/2020 0458 01/03/20 0500  Weight: 83.5 kg 84.9 kg    Telemetry    Atrial fib - Personally Reviewed  ECG    NA - Personally Reviewed  Physical Exam   GEN:    Critically ill appearing Neck:    Positive JVD Cardiac: Irregular RR, no murmurs, rubs, or gallops.  Respiratory:   Decreased breath sounds with bilateral basilar crackles GI: Soft, nontender, non-distended  MS: No  edema; No deformity. Neuro:  Nonfocal  Psych: Normal affect   Labs    Chemistry Recent Labs  Lab 01/03/20 0258 01/04/20 0149 01/04/20 0549  NA 137 139 137  K 4.1 6.3* 4.6  CL 97* 100 95*  CO2 30 25 30   GLUCOSE 227* 142* 143*    BUN 39* 47* 46*  CREATININE 2.23* 2.41* 2.52*  CALCIUM 8.8* 9.0 8.7*  PROT 6.0*  --   --   ALBUMIN 3.1*  --   --   AST 35  --   --   ALT 22  --   --   ALKPHOS 62  --   --   BILITOT 0.6  --   --   GFRNONAA 19* 17* 17*  GFRAA 22* 20* 19*  ANIONGAP 10 14 12      Hematology Recent Labs  Lab 01/16/2020 0537  WBC 12.9*  RBC 4.77  HGB 14.3  HCT 46.9*  MCV 98.3  MCH 30.0  MCHC 30.5  RDW 13.4  PLT 206    Cardiac EnzymesNo results for input(s): TROPONINI in the last 168 hours. No results for input(s): TROPIPOC in the last 168 hours.   BNP Recent Labs  Lab 01/01/2020 0537  BNP 649.0*     DDimer No results for input(s): DDIMER in the last 168 hours.   Radiology    ECHOCARDIOGRAM COMPLETE  Result Date: 12/29/2019    ECHOCARDIOGRAM REPORT   Patient Name:  Cynthia James Date of Exam: 12/29/2019 Medical Rec #:  [id number]            Height:       60.0 in Accession #:    [id number]           Weight:       184.0 lb Date of Birth:  [birth year]            BSA:          1.802 m Patient Age:    86 years             BP:           109/60 mmHg Patient Gender: F                    HR:           112 bpm. Exam Location:  [facility] Procedure: 2D Echo Indications:    NSTEMI I21.4  History:        Patient has prior history of Echocardiogram examinations, most                 recent 10/24/2019. CHF, Previous Myocardial Infarction, COPD,                 Arrythmias:Atrial Fibrillation, Signs/Symptoms:Dyspnea; Risk                 Factors:Hypertension, Diabetes, Dyslipidemia and Former Smoker.  Sonographer:    Leavy Cella RDCS (AE) Referring Phys: [phone] DAVID TAT IMPRESSIONS  1. The entire apex is akinetic. Essentially the entire mid to distal ventricle is hypokinetic, with normal function at the base. Pattern could be consistent with stress induced cardiomyopathy/Takotsubo CM, cannot exclude ischemic heart disease. . Left ventricular ejection fraction, by estimation, is 25 to 30%. The left ventricle  has severely decreased function. The left ventricle demonstrates regional wall motion abnormalities (see scoring diagram/findings for description). Left ventricular diastolic parameters are indeterminate.  2. RV poorly visualized, grossly appears enlarged with mild systolic dysfunction. . Right ventricular systolic function was not well visualized. The right ventricular size is not well visualized.  3. The mitral valve is normal in structure. Trivial mitral valve regurgitation. No evidence of mitral stenosis.  4. Tricuspid valve regurgitation is moderate.  5. The aortic valve is tricuspid. Aortic valve regurgitation is not visualized. No aortic stenosis is present.  6. Mild pulmonary HTN, PASP is 38 mmHg.  7. The inferior vena cava is normal in size with greater than 50% respiratory variability, suggesting right atrial pressure of 3 mmHg. FINDINGS  Left Ventricle: The entire apex is akinetic. Essentially the entire mid to distal ventricle is hypokinetic, with normal function at the base. Pattern could be consistent with stress induced cardiomyopathy/Takotsubo CM, cannot exclude ischemic heart disease. Left ventricular ejection fraction, by estimation, is 25 to 30%. The left ventricle has severely decreased function. The left ventricle demonstrates regional wall motion abnormalities. The left ventricular internal cavity size was normal in size. There is no left ventricular hypertrophy. Left ventricular diastolic parameters are indeterminate. Right Ventricle: RV poorly visualized, grossly appears enlarged with mild systolic dysfunction. The right ventricular size is not well visualized. Right vetricular wall thickness was not assessed. Right ventricular systolic function was not well visualized. The tricuspid regurgitant velocity is 2.84 m/s, and with an assumed right atrial pressure of 10 mmHg, the estimated right ventricular systolic pressure is 89.3 mmHg. Left Atrium: Left atrial size was normal in size. Right  Atrium: Right  atrial size was normal in size. Pericardium: There is no evidence of pericardial effusion. Mitral Valve: The mitral valve is normal in structure. There is mild thickening of the mitral valve leaflet(s). There is mild calcification of the mitral valve leaflet(s). Moderate mitral annular calcification. Trivial mitral valve regurgitation. No evidence of mitral valve stenosis. Tricuspid Valve: The tricuspid valve is not well visualized. Tricuspid valve regurgitation is moderate . No evidence of tricuspid stenosis. Aortic Valve: The aortic valve is tricuspid. . There is moderate thickening and moderate calcification of the aortic valve. Aortic valve regurgitation is not visualized. No aortic stenosis is present. Moderate aortic valve annular calcification. There is  moderate thickening of the aortic valve. There is moderate calcification of the aortic valve. Aortic valve mean gradient measures 3.3 mmHg. Aortic valve peak gradient measures 6.3 mmHg. Aortic valve area, by VTI measures 1.61 cm. Pulmonic Valve: The pulmonic valve was not well visualized. Pulmonic valve regurgitation is not visualized. No evidence of pulmonic stenosis. Aorta: The aortic root is normal in size and structure. Pulmonary Artery: Mild pulmonary HTN, PASP is 38 mmHg. Venous: The inferior vena cava is normal in size with greater than 50% respiratory variability, suggesting right atrial pressure of 3 mmHg. IAS/Shunts: No atrial level shunt detected by color flow Doppler.  LEFT VENTRICLE PLAX 2D LVOT diam:     1.90 cm  Diastology LV SV:         34       LV e' lateral:   7.28 cm/s LV SV Index:   19       LV E/e' lateral: 11.2 LVOT Area:     2.84 cm LV e' medial:    6.46 cm/s                         LV E/e' medial:  12.7  RIGHT VENTRICLE RV S prime:     6.18 cm/s TAPSE (M-mode): 0.9 cm LEFT ATRIUM             Index       RIGHT ATRIUM           Index LA Vol (A2C):   52.2 ml 28.97 ml/m RA Area:     12.50 cm LA Vol (A4C):   47.4 ml 26.31  ml/m RA Volume:   31.40 ml  17.43 ml/m LA Biplane Vol: 50.9 ml 28.25 ml/m  AORTIC VALVE AV Area (Vmax):    1.63 cm AV Area (Vmean):   1.51 cm AV Area (VTI):     1.61 cm AV Vmax:           125.08 cm/s AV Vmean:          84.286 cm/s AV VTI:            0.208 m AV Peak Grad:      6.3 mmHg AV Mean Grad:      3.3 mmHg LVOT Vmax:         71.98 cm/s LVOT Vmean:        44.777 cm/s LVOT VTI:          0.118 m LVOT/AV VTI ratio: 0.57 MITRAL VALVE               TRICUSPID VALVE MV Area (PHT): 3.63 cm    TR Peak grad:   32.3 mmHg MV Decel Time: 209 msec    TR Vmax:        284.00 cm/s MV E velocity: 81.80 cm/s MV  A velocity: 25.70 cm/s  SHUNTS MV E/A ratio:  3.18        Systemic VTI:  0.12 m                            Systemic Diam: 1.90 cm Carlyle Dolly MD Electronically signed by Carlyle Dolly MD Signature Date/Time: 12/25/2019/12:03:20 PM    Final     Cardiac Studies   ECHO:  1. The entire apex is akinetic. Essentially the entire mid to distal  ventricle is hypokinetic, with normal function at the base. Pattern could  be consistent with stress induced cardiomyopathy/Takotsubo CM, cannot  exclude ischemic heart disease. . Left  ventricular ejection fraction, by estimation, is 25 to 30%. The left  ventricle has severely decreased function. The left ventricle demonstrates  regional wall motion abnormalities (see scoring diagram/findings for  description). Left ventricular diastolic  parameters are indeterminate.  2. RV poorly visualized, grossly appears enlarged with mild systolic  dysfunction. . Right ventricular systolic function was not well  visualized. The right ventricular size is not well visualized.  3. The mitral valve is normal in structure. Trivial mitral valve  regurgitation. No evidence of mitral stenosis.  4. Tricuspid valve regurgitation is moderate.  5. The aortic valve is tricuspid. Aortic valve regurgitation is not  visualized. No aortic stenosis is present.  6. Mild pulmonary  HTN, PASP is 38 mmHg.  7. The inferior vena cava is normal in size with greater than 50%  respiratory variability, suggesting right atrial pressure of 3 mmHg.   Patient Profile     86 y.o. female with past medical history of permanent atrial fibrillation, chronic diastolic CHF, HTN, HLD, Type 2 DM, OSA (on CPAP) and Stage 3-4 CKDwho is being seen for the evaluation of NSTEMIat the request of Dr. Carles Collet.   Assessment & Plan    ACUTE ST ELEVATION MI:    Plan was for cath but is was put on hold secondary to elevated INR.   Now I have suggested no cath given the rising creat and the fact that considerable time as passed since likely a completed infarct.  Will start IV heparin now that INR is down.    ACUTE SYSTOLIC HF:  EF is now down to 25 - 30%.    Avoiding ACE/ARB and ARNI and also beta blocker for now with acute decompensated HF.  Needs IV diureses but will need to watch creat closely.    AKI:    Creat is up slightly.   Holding ACE/ARB/ARNI.    ATRIAL FIB:    Warfarin on hold.    INR is down now to 1.4.  Start heparin.   DNR:  Patient requests this and I will enter this in the chart.   I called and spoke with her daughter.  Condition is tenuous.  She understands plans for medical therapy without cath at this point.   For questions or updates, please contact [place] Please consult [email or web address] for contact info under Cardiology/STEMI.   Signed, Minus Breeding, MD  01/04/2020, 8:29 AM

## 2020-01-04 NOTE — Progress Notes (Addendum)
PM rounding note  SOB which is a new complaint; diuresing   Fingers are cold with cap refill > 3 sec Crackles 1/2 up JVD jaw  Will discuss with colleagues re iontropic support for her CHF and poor perfusion  DB suggested milrinone and he will look in on her

## 2020-01-04 NOTE — Progress Notes (Signed)
[place] for Heparin Indication: chest pain/ACS and atrial fibrillation  Allergies  Allergen Reactions  . Codeine Nausea And Vomiting  . Percocet [Oxycodone-Acetaminophen] Nausea And Vomiting  . Tramadol Nausea And Vomiting  . Vicodin [Hydrocodone-Acetaminophen] Nausea And Vomiting    Patient Measurements: Height: 5' (152.4 cm) Weight: 84.9 kg (187 lb 2.7 oz) IBW/kg (Calculated) : 45.5 Heparin Dosing Weight: 65 kg  Vital Signs: Temp: 98.1 F (36.7 C) (08/14 1940) Temp Source: Oral (08/14 1940) BP: 128/70 (08/14 2000) Pulse Rate: 98 (08/14 2100)  Labs: Recent Labs    12/22/2019 0537 01/01/2020 0537 12/30/2019 0708 01/17/2020 1715 01/03/20 0258 01/04/20 0149 01/04/20 0549 01/04/20 2023  HGB 14.3  --   --   --   --   --  12.5  --   HCT 46.9*  --   --   --   --   --  42.2  --   PLT 206  --   --   --   --   --  207  --   LABPROT 29.2*   < >  --  32.6* 28.6* 16.3*  --   --   INR 2.9*   < >  --  3.3* 2.8* 1.4*  --   --   HEPARINUNFRC  --   --   --   --   --   --   --  0.17*  CREATININE 1.66*   < >  --   --  2.23* 2.41* 2.52*  --   TROPONINIHS 3,712*  --  4,700*  --  1,581*  --   --   --    < > = values in this interval not displayed.    Estimated Creatinine Clearance: 15.2 mL/min (A) (by C-G formula based on SCr of 2.52 mg/dL (H)).   Medical History: Past Medical History:  Diagnosis Date  . A-fib ([place])   . Arthritis   . Diabetes mellitus   . Full dentures   . High cholesterol   . Hypertension   . Osteoporosis   . Renal disorder   . Wears glasses     Assessment: 86 yo F with a history of permanent Afib on warfarin PTA presents with NSTEMI. Plan was for cardiac cath today, but decision made to medically manage given worsening renal function and likelihood of a completed infarct. INR on admission was 2.9 and peaked at 3.3 on 8/12. She received a total of 17.5mg  of PO vitamin K, most recent dose on 8/13. INR today <2 at 1.4, so  pharmacy asked to start heparin.   PTA warfarin dose: 4mg  daily (last dose 8/11)  Initial heparin level came back at 0.17, on 900 units/hr. CBC stable on last check. No s/sx of bleeding or infusion issues per nursing.   Goal of Therapy:  Heparin level 0.3-0.7 units/ml Monitor platelets by anticoagulation protocol: Yes   Plan:  Increase heparin level to 1100 units/hr  Check 8-hr HL Monitor daily HL, CBC, s/sx bleeding  Antonietta Jewel, PharmD, BCCCP Clinical Pharmacist  Phone: [phone] 01/04/2020 9:14 PM  Please check AMION for all [place] phone numbers After 10:00 PM, call [place] [phone]

## 2020-01-04 NOTE — Progress Notes (Signed)
Patient states that in the event that her heart stops or that she should stop breathing she does not wish to have life saving measures preformed. She further states that there should be DNR paperwork in the computer. It could not be located. She further states that she does have a copy at home and the "physicians know". She has been asked to have her daughter bring in the document or to discuss in the morning with the physicians.

## 2020-01-04 NOTE — Progress Notes (Signed)
ANTICOAGULATION CONSULT NOTE - Initial Consult  Pharmacy Consult for Heparin Indication: chest pain/ACS and atrial fibrillation  Allergies  Allergen Reactions  . Codeine Nausea And Vomiting  . Percocet [Oxycodone-Acetaminophen] Nausea And Vomiting  . Tramadol Nausea And Vomiting  . Vicodin [Hydrocodone-Acetaminophen] Nausea And Vomiting    Patient Measurements: Height: 5' (152.4 cm) Weight: 84.9 kg (187 lb 2.7 oz) IBW/kg (Calculated) : 45.5 Heparin Dosing Weight: 65 kg  Vital Signs: Temp: 98.6 F (37 C) (08/14 0835) Temp Source: Oral (08/14 0835) BP: 119/83 (08/14 0700) Pulse Rate: 98 (08/14 0700)  Labs: Recent Labs    12/24/2019 0537 01/21/2020 0537 12/31/2019 0708 12/29/2019 1715 01/03/20 0258 01/04/20 0149 01/04/20 0549  HGB 14.3  --   --   --   --   --   --   HCT 46.9*  --   --   --   --   --   --   PLT 206  --   --   --   --   --   --   LABPROT 29.2*   < >  --  32.6* 28.6* 16.3*  --   INR 2.9*   < >  --  3.3* 2.8* 1.4*  --   CREATININE 1.66*   < >  --   --  2.23* 2.41* 2.52*  TROPONINIHS 3,712*  --  4,700*  --  1,581*  --   --    < > = values in this interval not displayed.    Estimated Creatinine Clearance: 15.2 mL/min (A) (by C-G formula based on SCr of 2.52 mg/dL (H)).   Medical History: Past Medical History:  Diagnosis Date  . A-fib ([place])   . Arthritis   . Diabetes mellitus   . Full dentures   . High cholesterol   . Hypertension   . Osteoporosis   . Renal disorder   . Wears glasses     Assessment: 86 yo F with a history of permanent Afib on warfarin PTA presents with NSTEMI. Plan was for cardiac cath today, but decision made to medically manage given worsening renal function and likelihood of a completed infarct. INR on admission was 2.9 and peaked at 3.3 on 8/12. She received a total of 17.5mg  of PO vitamin K, most recent dose on 8/13. INR today <2 at 1.4, so pharmacy asked to start heparin. Most recent CBC wnl on 8/12.  PTA warfarin dose: 4mg  daily  (last dose 8/11) INR on admission: 2.9  Goal of Therapy:  Heparin level 0.3-0.7 units/ml Monitor platelets by anticoagulation protocol: Yes   Plan:  Start heparin at 900 units/hr (no bolus) Check 8-hr HL Monitor daily HL, CBC, s/sx bleeding  Richardine Service, PharmD PGY2 Cardiology Pharmacy Resident Phone: [phone] 01/04/2020  9:14 AM  Please check [email or web address] for unit-specific pharmacy phone numbers.

## 2020-01-05 ENCOUNTER — Inpatient Hospital Stay (HOSPITAL_COMMUNITY): Payer: Medicare HMO

## 2020-01-05 ENCOUNTER — Inpatient Hospital Stay: Payer: Self-pay

## 2020-01-05 DIAGNOSIS — I5043 Acute on chronic combined systolic (congestive) and diastolic (congestive) heart failure: Secondary | ICD-10-CM

## 2020-01-05 LAB — BASIC METABOLIC PANEL
Anion gap: 11 (ref 5–15)
BUN: 59 mg/dL — ABNORMAL HIGH (ref 8–23)
CO2: 34 mmol/L — ABNORMAL HIGH (ref 22–32)
Calcium: 8 mg/dL — ABNORMAL LOW (ref 8.9–10.3)
Chloride: 91 mmol/L — ABNORMAL LOW (ref 98–111)
Creatinine, Ser: 2.52 mg/dL — ABNORMAL HIGH (ref 0.44–1.00)
GFR calc Af Amer: 19 mL/min — ABNORMAL LOW (ref >60)
GFR calc non Af Amer: 17 mL/min — ABNORMAL LOW (ref >60)
Glucose, Bld: 250 mg/dL — ABNORMAL HIGH (ref 70–99)
Potassium: 4 mmol/L (ref 3.5–5.1)
Sodium: 136 mmol/L (ref 135–145)

## 2020-01-05 LAB — CBC
HCT: 37.2 % (ref 36.0–46.0)
Hemoglobin: 11.4 g/dL — ABNORMAL LOW (ref 12.0–15.0)
MCH: 29.2 pg (ref 26.0–34.0)
MCHC: 30.6 g/dL (ref 30.0–36.0)
MCV: 95.1 fL (ref 80.0–100.0)
Platelets: 243 10*3/uL (ref 150–400)
RBC: 3.91 MIL/uL (ref 3.87–5.11)
RDW: 12.8 % (ref 11.5–15.5)
WBC: 10.7 10*3/uL — ABNORMAL HIGH (ref 4.0–10.5)
nRBC: 0 % (ref 0.0–0.2)

## 2020-01-05 LAB — GLUCOSE, CAPILLARY
Glucose-Capillary: 154 mg/dL — ABNORMAL HIGH (ref 70–99)
Glucose-Capillary: 160 mg/dL — ABNORMAL HIGH (ref 70–99)
Glucose-Capillary: 163 mg/dL — ABNORMAL HIGH (ref 70–99)
Glucose-Capillary: 245 mg/dL — ABNORMAL HIGH (ref 70–99)
Glucose-Capillary: 274 mg/dL — ABNORMAL HIGH (ref 70–99)
Glucose-Capillary: 307 mg/dL — ABNORMAL HIGH (ref 70–99)

## 2020-01-05 LAB — PROTIME-INR
INR: 1.1 (ref 0.8–1.2)
Prothrombin Time: 14.1 seconds (ref 11.4–15.2)

## 2020-01-05 LAB — COOXEMETRY PANEL
Carboxyhemoglobin: 1.5 % (ref 0.5–1.5)
Methemoglobin: 0.9 % (ref 0.0–1.5)
O2 Saturation: 72.9 %
Total hemoglobin: 11.9 g/dL — ABNORMAL LOW (ref 12.0–16.0)

## 2020-01-05 LAB — HEPARIN LEVEL (UNFRACTIONATED): Heparin Unfractionated: 0.47 IU/mL (ref 0.30–0.70)

## 2020-01-05 MED ORDER — FUROSEMIDE 10 MG/ML IJ SOLN
10.0000 mg/h | INTRAVENOUS | Status: DC
  Administered 2020-01-05: 8 mg/h via INTRAVENOUS
  Filled 2020-01-05: qty 20
  Filled 2020-01-05: qty 25

## 2020-01-05 MED ORDER — SODIUM CHLORIDE 0.9% FLUSH
10.0000 mL | Freq: Two times a day (BID) | INTRAVENOUS | Status: DC
  Administered 2020-01-05 – 2020-01-06 (×2): 10 mL

## 2020-01-05 MED ORDER — ORAL CARE MOUTH RINSE
15.0000 mL | Freq: Two times a day (BID) | OROMUCOSAL | Status: DC
  Administered 2020-01-05 – 2020-01-12 (×9): 15 mL via OROMUCOSAL

## 2020-01-05 MED ORDER — WARFARIN SODIUM 2 MG PO TABS
4.0000 mg | ORAL_TABLET | Freq: Once | ORAL | Status: DC
  Filled 2020-01-05: qty 2

## 2020-01-05 MED ORDER — WARFARIN - PHARMACIST DOSING INPATIENT: Freq: Every day | Status: DC

## 2020-01-05 MED ORDER — SODIUM CHLORIDE 0.9% FLUSH: 10.0000 mL | INTRAVENOUS | Status: DC | PRN

## 2020-01-05 NOTE — Progress Notes (Signed)
Progress Note  Patient Name: Cynthia James Date of Encounter: 01/05/2020  Primary Cardiologist:   No primary care provider on file.  (Sees Dr. Gennette Pac at [facility])   Subjective   No distress and denies SOB or pain.  However, she is having tachypnea.    Inpatient Medications    Scheduled Meds: . aspirin EC  81 mg Oral Daily  . atorvastatin  80 mg Oral Daily  . Chlorhexidine Gluconate Cloth  6 each Topical Daily  . fluorometholone  1 drop Both Eyes q morning - 10a  . furosemide  80 mg Intravenous BID  . insulin aspart  0-9 Units Subcutaneous Q4H  . insulin glargine  10 Units Subcutaneous q morning - 10a  . latanoprost  1 drop Both Eyes QHS  . mometasone-formoterol  2 puff Inhalation BID  . sodium chloride flush  3 mL Intravenous Q12H   Continuous Infusions: . sodium chloride    . amiodarone 30 mg/hr (01/04/20 2347)  . heparin 1,100 Units/hr (01/04/20 2153)  . milrinone 0.25 mcg/kg/min (01/05/20 0733)   PRN Meds: sodium chloride, acetaminophen, ondansetron (ZOFRAN) IV, sodium chloride flush   Vital Signs    Vitals:   01/05/20 0500 01/05/20 0600 01/05/20 0700 01/05/20 0749  BP: (!) 106/54 (!) 97/51    Pulse: (!) 109 100 (!) 114   Resp: (!) 21 (!) 21 18   Temp:      TempSrc:      SpO2: 100% 100% 99% 97%  Weight:      Height:        Intake/Output Summary (Last 24 hours) at 01/05/2020 0755 Last data filed at 01/05/2020 0700 Gross per 24 hour  Intake 1497.77 ml  Output 600 ml  Net 897.77 ml   Filed Weights   12/31/2019 0458 01/03/20 0500 01/05/20 0400  Weight: 83.5 kg 84.9 kg 86.7 kg    Telemetry    Atrial fib with rapid rate - Personally Reviewed  ECG    NA - Personally Reviewed  Physical Exam   GEN: No  acute distress.   Neck: No  JVD Cardiac: Irregular RR, no murmurs, rubs, or gallops.  Respiratory:   Decreased breath sounds with fine crackles 1/3 of the way up.   GI: Soft, nontender, non-distended, normal bowel sounds  MS:  Mild edema; No  deformity. Neuro:   Nonfocal  Psych: Oriented and appropriate    Labs    Chemistry Recent Labs  Lab 01/03/20 0258 01/04/20 0149 01/04/20 0549  NA 137 139 137  K 4.1 6.3* 4.6  CL 97* 100 95*  CO2 30 25 30   GLUCOSE 227* 142* 143*  BUN 39* 47* 46*  CREATININE 2.23* 2.41* 2.52*  CALCIUM 8.8* 9.0 8.7*  PROT 6.0*  --   --   ALBUMIN 3.1*  --   --   AST 35  --   --   ALT 22  --   --   ALKPHOS 62  --   --   BILITOT 0.6  --   --   GFRNONAA 19* 17* 17*  GFRAA 22* 20* 19*  ANIONGAP 10 14 12      Hematology Recent Labs  Lab 01/14/2020 0537 01/04/20 0549  WBC 12.9* 9.3  RBC 4.77 4.29  HGB 14.3 12.5  HCT 46.9* 42.2  MCV 98.3 98.4  MCH 30.0 29.1  MCHC 30.5 29.6*  RDW 13.4 13.3  PLT 206 207    Cardiac EnzymesNo results for input(s): TROPONINI in the last 168 hours. No  results for input(s): TROPIPOC in the last 168 hours.   BNP Recent Labs  Lab 01/11/2020 0537  BNP 649.0*     DDimer No results for input(s): DDIMER in the last 168 hours.   Radiology    No results found.  Cardiac Studies   ECHO:  1. The entire apex is akinetic. Essentially the entire mid to distal  ventricle is hypokinetic, with normal function at the base. Pattern could  be consistent with stress induced cardiomyopathy/Takotsubo CM, cannot  exclude ischemic heart disease. . Left  ventricular ejection fraction, by estimation, is 25 to 30%. The left  ventricle has severely decreased function. The left ventricle demonstrates  regional wall motion abnormalities (see scoring diagram/findings for  description). Left ventricular diastolic  parameters are indeterminate.  2. RV poorly visualized, grossly appears enlarged with mild systolic  dysfunction. . Right ventricular systolic function was not well  visualized. The right ventricular size is not well visualized.  3. The mitral valve is normal in structure. Trivial mitral valve  regurgitation. No evidence of mitral stenosis.  4. Tricuspid valve  regurgitation is moderate.  5. The aortic valve is tricuspid. Aortic valve regurgitation is not  visualized. No aortic stenosis is present.  6. Mild pulmonary HTN, PASP is 38 mmHg.  7. The inferior vena cava is normal in size with greater than 50%  respiratory variability, suggesting right atrial pressure of 3 mmHg.   Patient Profile     86 y.o. female with past medical history of permanent atrial fibrillation, chronic diastolic CHF, HTN, HLD, Type 2 DM, OSA (on CPAP) and Stage 3-4 CKDwho is being seen for the evaluation of NSTEMIat the request of Dr. Carles Collet.   Assessment & Plan    ACUTE ST ELEVATION MI:    Plan was for cath but is was put on hold secondary to elevated INR.   Now I have suggested no cath given the rising creat and the fact that considerable time as passed since likely a completed infarct.   On IV heparin.  I will restart PO warfarin.     ACUTE SYSTOLIC HF:  EF is now down to 25 - 30%.    Avoiding ACE/ARB and ARNI and also beta blocker for now with acute decompensated HF.  Lasix increased to 80 mg IV bid yesterday.  Later in the day started on milrinone.  Very little urine output.  I am awaiting the BMET and will make a suggestion about changing diuretic likely to continuous IV infusion   AKI:    Creat pending.   Holding ACE/ARB/ARNI.    ATRIAL FIB:    Restart warfarin.  Also on heparin.    On IV amiodarone for rate control.    DNR:  Patient requests this was entered into the chart per her request.    For questions or updates, please contact [place] Please consult [email or web address] for contact info under Cardiology/STEMI.   Signed, Minus Breeding, MD  01/05/2020, 7:55 AM

## 2020-01-05 NOTE — Progress Notes (Addendum)
A fib overnight Rate to 130 Pt continues with SOB on exertion and tachypnea at rest Denies pain Poor appetite (per pt, r/t food not being to her taste more than SOB with eating); ensure provided between meals at the request of rounding provider  1200 CBG 307; will switch ensure to glucerna Delay in tx r/t lost BMET tube Redraw hemolyzed; sent to collect again  1400 PICC line orders corrected and communicated with IV Team who will come place. Daughter at bedside; all questions answered.  pt has questions about restarting warfarin. her PCP, Dr. Jola Schmidt @ [facility], mentioned switching or stopping. "the last time i saw her she said she wanted to get me off the warfarin and put me on this other medicine so I wouldn't have to get tested so often" Education provided x2 with pt and visitor at bedside; pt still did not want to resume. D/w Dr. Percival Spanish and pharmacy

## 2020-01-05 NOTE — Progress Notes (Signed)
Patients dentures placed in denture cup, bilat hearing aids placed in cup and also placed at bedside.

## 2020-01-05 NOTE — Progress Notes (Signed)
Spoke to Dr. Lovey Newcomer concerning [facility] confirming PICC tip placement. Dr. Rosana Hoes confirmed that the tip is located in the middle SVC. [place], RN informed that orders to use PICC will be placed and good to use.

## 2020-01-05 NOTE — Progress Notes (Signed)
[place] for Heparin + warfarin Indication: chest pain/ACS and atrial fibrillation  Allergies  Allergen Reactions  . Codeine Nausea And Vomiting  . Percocet [Oxycodone-Acetaminophen] Nausea And Vomiting  . Tramadol Nausea And Vomiting  . Vicodin [Hydrocodone-Acetaminophen] Nausea And Vomiting    Patient Measurements: Height: 5' (152.4 cm) Weight: 86.7 kg (191 lb 2.2 oz) IBW/kg (Calculated) : 45.5 Heparin Dosing Weight: 65 kg  Vital Signs: Temp: 99.2 F (37.3 C) (08/15 1116) Temp Source: Oral (08/15 1116) BP: 103/59 (08/15 1100) Pulse Rate: 111 (08/15 1000)  Labs: Recent Labs    01/03/20 0258 01/04/20 0149 01/04/20 0549 01/04/20 2023 01/05/20 0609  HGB  --   --  12.5  --   --   HCT  --   --  42.2  --   --   PLT  --   --  207  --   --   LABPROT 28.6* 16.3*  --   --  14.1  INR 2.8* 1.4*  --   --  1.1  HEPARINUNFRC  --   --   --  0.17* 0.47  CREATININE 2.23* 2.41* 2.52*  --   --   TROPONINIHS 1,581*  --   --   --   --     Estimated Creatinine Clearance: 15.4 mL/min (A) (by C-G formula based on SCr of 2.52 mg/dL (H)).   Medical History: Past Medical History:  Diagnosis Date  . A-fib ([place])   . Arthritis   . Diabetes mellitus   . Full dentures   . High cholesterol   . Hypertension   . Osteoporosis   . Renal disorder   . Wears glasses     Assessment: 86 yo F with a history of permanent Afib on warfarin PTA presents with NSTEMI. Plan was for cardiac cath today, but decision made to medically manage given worsening renal function and likelihood of a completed infarct. INR on admission was 2.9 and peaked at 3.3 on 8/12. She received a total of 17.5mg  of PO vitamin K, most recent dose on 8/13. Heparin was started when INR <2 on 8/14.  Heparin level now therapeutic at 0.47 after increasing drip rate to 1100 units/hr. CBC stable on last check. No s/sx of bleeding or infusion issues per nursing. Restarting warfarin today. INR 1.1  subtherapeutic this AM.  PTA warfarin dose: 4mg  daily (last dose 8/11)  Goal of Therapy:  INR 2-3 Heparin level 0.3-0.7 units/ml Monitor platelets by anticoagulation protocol: Yes   Plan:  Warfarin 4mg  PO x1 tonight Continue heparin infusion at 1100 units/hr  Monitor daily INR, HL, CBC, s/sx bleeding  Richardine Service, PharmD PGY2 Cardiology Pharmacy Resident Phone: [phone] 01/05/2020  1:39 PM  Please check [email or web address] for unit-specific pharmacy phone numbers.

## 2020-01-05 NOTE — Progress Notes (Signed)
Peripherally Inserted Central Catheter Placement  The IV Nurse has discussed with the patient and/or persons authorized to consent for the patient, the purpose of this procedure and the potential benefits and risks involved with this procedure.  The benefits include less needle sticks, lab draws from the catheter, and the patient may be discharged home with the catheter. Risks include, but not limited to, infection, bleeding, blood clot (thrombus formation), and puncture of an artery; nerve damage and irregular heartbeat and possibility to perform a PICC exchange if needed/ordered by physician.  Alternatives to this procedure were also discussed.  Bard Power PICC patient education guide, fact sheet on infection prevention and patient information card has been provided to patient /or left at bedside.    PICC Placement Documentation  PICC Double Lumen 01/05/20 PICC Right Cephalic 34 cm 0 cm (Active)  Indication for Insertion or Continuance of Line Vasoactive infusions 01/05/20 1600  Exposed Catheter (cm) 0 cm 01/05/20 1600  Site Assessment Clean;Dry;Intact 01/05/20 1600  Lumen #1 Status Flushed;Blood return noted;Saline locked 01/05/20 1600  Lumen #2 Status Flushed;Blood return noted;Saline locked 01/05/20 1600  Dressing Type Transparent;Securing device 01/05/20 1600  Dressing Status Clean;Dry;Intact;Antimicrobial disc in place 01/05/20 1600  Safety Lock Not Applicable 44/45/84 8350  Line Care Connections checked and tightened 01/05/20 1600  Line Adjustment (NICU/IV Team Only) No 01/05/20 1600  Dressing Intervention New dressing 01/05/20 1600  Dressing Change Due 2020/01/30 01/05/20 1600       Lorenza Cambridge 01/05/2020, 4:16 PM

## 2020-01-06 ENCOUNTER — Inpatient Hospital Stay (HOSPITAL_COMMUNITY): Payer: Medicare HMO

## 2020-01-06 DIAGNOSIS — Z7189 Other specified counseling: Secondary | ICD-10-CM

## 2020-01-06 DIAGNOSIS — R570 Cardiogenic shock: Secondary | ICD-10-CM

## 2020-01-06 DIAGNOSIS — Z66 Do not resuscitate: Secondary | ICD-10-CM

## 2020-01-06 DIAGNOSIS — Z515 Encounter for palliative care: Secondary | ICD-10-CM

## 2020-01-06 LAB — BASIC METABOLIC PANEL
Anion gap: 13 (ref 5–15)
BUN: 57 mg/dL — ABNORMAL HIGH (ref 8–23)
CO2: 34 mmol/L — ABNORMAL HIGH (ref 22–32)
Calcium: 8 mg/dL — ABNORMAL LOW (ref 8.9–10.3)
Chloride: 91 mmol/L — ABNORMAL LOW (ref 98–111)
Creatinine, Ser: 2.34 mg/dL — ABNORMAL HIGH (ref 0.44–1.00)
GFR calc Af Amer: 21 mL/min — ABNORMAL LOW (ref >60)
GFR calc non Af Amer: 18 mL/min — ABNORMAL LOW (ref >60)
Glucose, Bld: 125 mg/dL — ABNORMAL HIGH (ref 70–99)
Potassium: 3.9 mmol/L (ref 3.5–5.1)
Sodium: 138 mmol/L (ref 135–145)

## 2020-01-06 LAB — COOXEMETRY PANEL
Carboxyhemoglobin: 1.6 % — ABNORMAL HIGH (ref 0.5–1.5)
Methemoglobin: 1 % (ref 0.0–1.5)
O2 Saturation: 82.1 %
Total hemoglobin: 11.2 g/dL — ABNORMAL LOW (ref 12.0–16.0)

## 2020-01-06 LAB — GLUCOSE, CAPILLARY
Glucose-Capillary: 107 mg/dL — ABNORMAL HIGH (ref 70–99)
Glucose-Capillary: 193 mg/dL — ABNORMAL HIGH (ref 70–99)

## 2020-01-06 LAB — CBC
HCT: 34.2 % — ABNORMAL LOW (ref 36.0–46.0)
Hemoglobin: 10.5 g/dL — ABNORMAL LOW (ref 12.0–15.0)
MCH: 29.1 pg (ref 26.0–34.0)
MCHC: 30.7 g/dL (ref 30.0–36.0)
MCV: 94.7 fL (ref 80.0–100.0)
Platelets: 212 10*3/uL (ref 150–400)
RBC: 3.61 MIL/uL — ABNORMAL LOW (ref 3.87–5.11)
RDW: 12.9 % (ref 11.5–15.5)
WBC: 8.8 10*3/uL (ref 4.0–10.5)
nRBC: 0 % (ref 0.0–0.2)

## 2020-01-06 LAB — PROTIME-INR
INR: 1.2 (ref 0.8–1.2)
Prothrombin Time: 14.6 seconds (ref 11.4–15.2)

## 2020-01-06 LAB — HEPARIN LEVEL (UNFRACTIONATED): Heparin Unfractionated: 0.51 IU/mL (ref 0.30–0.70)

## 2020-01-06 MED ORDER — ACETAMINOPHEN 650 MG RE SUPP: 650.0000 mg | Freq: Four times a day (QID) | RECTAL | Status: DC | PRN

## 2020-01-06 MED ORDER — ACETAMINOPHEN 325 MG PO TABS
650.0000 mg | ORAL_TABLET | Freq: Four times a day (QID) | ORAL | Status: DC | PRN
  Administered 2020-01-07: 650 mg via ORAL
  Filled 2020-01-06: qty 2

## 2020-01-06 MED ORDER — LORAZEPAM 2 MG/ML IJ SOLN
0.5000 mg | INTRAMUSCULAR | Status: DC | PRN
  Administered 2020-01-10: 1 mg via INTRAVENOUS
  Administered 2020-01-10: 0.5 mg via INTRAVENOUS
  Administered 2020-01-11 – 2020-01-12 (×4): 1 mg via INTRAVENOUS
  Filled 2020-01-06 (×6): qty 1

## 2020-01-06 MED ORDER — GLYCOPYRROLATE 0.2 MG/ML IJ SOLN
0.4000 mg | INTRAMUSCULAR | Status: DC | PRN
  Administered 2020-01-12 (×2): 0.4 mg via INTRAVENOUS
  Filled 2020-01-06 (×3): qty 2

## 2020-01-06 MED ORDER — BIOTENE DRY MOUTH MT LIQD: 15.0000 mL | OROMUCOSAL | Status: DC | PRN

## 2020-01-06 MED ORDER — BISACODYL 10 MG RE SUPP: 10.0000 mg | Freq: Every day | RECTAL | Status: DC | PRN

## 2020-01-06 MED ORDER — METOPROLOL TARTRATE 12.5 MG HALF TABLET
12.5000 mg | ORAL_TABLET | Freq: Three times a day (TID) | ORAL | Status: DC
  Administered 2020-01-06 – 2020-01-08 (×7): 12.5 mg via ORAL
  Filled 2020-01-06 (×8): qty 1

## 2020-01-06 MED ORDER — POLYVINYL ALCOHOL 1.4 % OP SOLN
1.0000 [drp] | Freq: Four times a day (QID) | OPHTHALMIC | Status: DC | PRN
  Filled 2020-01-06: qty 15

## 2020-01-06 MED ORDER — FENTANYL CITRATE (PF) 100 MCG/2ML IJ SOLN
25.0000 ug | INTRAMUSCULAR | Status: DC | PRN
  Administered 2020-01-07: 50 ug via INTRAVENOUS
  Filled 2020-01-06: qty 2

## 2020-01-06 MED ORDER — LORAZEPAM 2 MG/ML IJ SOLN: 0.5000 mg | Freq: Four times a day (QID) | INTRAMUSCULAR | Status: DC | PRN

## 2020-01-06 NOTE — Progress Notes (Signed)
Palliative medicine consult placed and spoke to their coordinator to request.

## 2020-01-06 NOTE — Consult Note (Addendum)
Palliative Medicine Inpatient Consult Note  Reason for consult:  Goals of Care "goals of care, symptom management for acute MI with cardiogenic shock"  HPI:  Per intake H&P --> 86 y.o. female with permanent atrial fibrillation on long-term warfarin, chronic diastolic heart failure, type 2 diabetes, and obstructive sleep apnea with stage IV chronic kidney disease, presenting with an anterior MI.Not improving despite aggressive efforts. Patient does not wish to continue with life prolonging measures.  Palliative care was asked to help with symptom management.  Clinical Assessment/Goals of Care: I have reviewed medical records including EPIC notes, labs and imaging, received report from bedside RN, assessed the patient who was lying in bed alert and oriented.    I met with Cynthia James and her daughter,  Cynthia James to further discuss diagnosis prognosis, GOC, EOL wishes, disposition and options.   I introduced Palliative Medicine as specialized medical care for people living with serious illness. It focuses on providing relief from the symptoms and stress of a serious illness. The goal is to improve quality of life for both the patient and the family.  I asked Cynthia James to tell me about herself. She shares that she is from [place] area. She moved to [place] in 2006 to be closer to her daughter who lived here. She is a widow, she was married twice. Her second marriage was for thirty years. She has a son and a daughter. Her other daughter passed away. 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  I asked Cynthia James how she was leading up to hospitalization. She shares that she was functioning well and able to perform the majority of her bADL's without assistance. She has been living with her daughter, Cynthia James.   A detailed discussion was had today regarding advanced directives, we do not have any on file or in [facility]  though presently patients states that if unable to make decisions for herself she would rely on her daughter, Cynthia James to make decisions for her.   Concepts specific to code status, artifical feeding and hydration, continued IV antibiotics and rehospitalization was had.Patient is a DNAR/DNI - she would not wish for any life prolonging efforts to be made to sustain life.   Given her rapid hospital decline she states that she no longer wishes for aggressive interventions. Per review of cardiology notes she is not improving and remains critical d/t cardiogenic shock.  Cynthia James expressed "I want to go to sleep and not wake up." We talked about transition to comfort measures in house and what that would entail inclusive of medications to control pain, dyspnea, agitation, nausea, itching, and hiccups.  We discussed stopping all uneccessary measures such as blood draws, needle sticks, and frequent vital signs.   Discussed the importance of continued conversation with family and their  medical providers regarding overall plan of care and treatment options, ensuring decisions are within the context of the patients values and GOCs.  Decision Maker: Patient is able to make decisions for herself  Summary OF RECOMMENDATIONS   DNAR/DNI  Transition to comfort measures  Patient is a [place] witness - Her daughter will reach out for her hospital liaison to visit  If patient remains stable from a symptom perspective for 24 hours we can proceed with the process of transfer to a residential hospice if not, she will be symptomatically optimized in house and be a celestial departure  Code Status/Advance Care Planning: DNAR/DNI  Symptom Management:  Dyspnea: Pain:  -  Fentanyl 25-7mg IV Q1H PRN Fever:  - Tylenol 6515mPO/PR Q6H PRN Agitation: Anxiety:  - Lorazepam 0.5-30m52mV  Q1H PRN Nausea:  - Zofran 4mg76m Q6H PRN  Secretions:  - Glycopyrrolate 0.4mg 55mQ4H PRN Dry Eyes:  - Artificial Tears  PRN Xerostomia:  - Biotene 15ml 77m - BID oral care Urinary Retention:  - Bladder scan if > 250ml p42m and maintain foley  Constipation:  - Bisacodyl 10mg PR13m QDay Spiritual:  - Patient is a Jehovah Witness - her daughter plans to request their hospice liaison to come in   Palliative Prophylaxis:   Oral Care, Turn Q2H, Pain, Dyspnea  Additional Recommendations (Limitations, Scope, Preferences):  Comfort oriented care   Psycho-social/Spiritual:   Desire for further Chaplaincy support: No  Additional Recommendations: Education on comfort - end of life care   Prognosis: Poor in the setting of   Discharge Planning: Unclear - If stable will transition to a residential hospice other will be cellestial  PPS: 10-20%   This conversation/these recommendations were discussed with patient primary care team, Dr. Cooper  Burt Knackn: 1510 Time Out: 1620 Total Time: 70 Greater than 50%  of this time was spent counseling and coordinating care related to the above assessment and plan.  [name] Cell Phone: [phone] secure chat with additional questions, if there is no response within 30 minutes please call the above phone number  Palliative Medicine Team providers are available by phone from 7am to 7pm daily and can be reached through the team cell phone.  Should this patient require assistance outside of these hours, please call the patient's attending physician.

## 2020-01-06 NOTE — Plan of Care (Signed)
  Problem: Role Relationship: Goal: Family's ability to cope with current situation will improve Outcome: Progressing   

## 2020-01-06 NOTE — Progress Notes (Addendum)
Progress Note  Patient Name: Cynthia James Date of Encounter: 01/06/2020  [facility] Cardiologist: No primary care provider on file.   Subjective   Patient states that she feels miserable.  She is very tired and just wants to go to sleep.  She denies chest pain or shortness of breath at rest.  Inpatient Medications    Scheduled Meds: . aspirin EC  81 mg Oral Daily  . atorvastatin  80 mg Oral Daily  . Chlorhexidine Gluconate Cloth  6 each Topical Daily  . fluorometholone  1 drop Both Eyes q morning - 10a  . insulin aspart  0-9 Units Subcutaneous Q4H  . insulin glargine  10 Units Subcutaneous q morning - 10a  . latanoprost  1 drop Both Eyes QHS  . mouth rinse  15 mL Mouth Rinse BID  . mometasone-formoterol  2 puff Inhalation BID  . sodium chloride flush  10-40 mL Intracatheter Q12H  . sodium chloride flush  3 mL Intravenous Q12H  . warfarin  4 mg Oral ONCE-1600  . Warfarin - Pharmacist Dosing Inpatient   Does not apply q1600   Continuous Infusions: . sodium chloride    . amiodarone 30 mg/hr (01/06/20 0700)  . furosemide (LASIX) infusion 8 mg/hr (01/06/20 0700)  . heparin 1,100 Units/hr (01/06/20 0700)  . milrinone 0.25 mcg/kg/min (01/06/20 0700)   PRN Meds: sodium chloride, acetaminophen, ondansetron (ZOFRAN) IV, sodium chloride flush, sodium chloride flush   Vital Signs    Vitals:   01/06/20 0600 01/06/20 0700 01/06/20 0752 01/06/20 0803  BP: (!) 112/51 (!) 153/67    Pulse: (!) 124 (!) 136    Resp: (!) 22 (!) 30    Temp:    98.7 F (37.1 C)  TempSrc:    Oral  SpO2: 100% 95% 98%   Weight:      Height:        Intake/Output Summary (Last 24 hours) at 01/06/2020 1007 Last data filed at 01/06/2020 0700 Gross per 24 hour  Intake 797.24 ml  Output 800 ml  Net -2.76 ml   Last 3 Weights 01/06/2020 01/05/2020 01/03/2020  Weight (lbs) 188 lb 0.8 oz 191 lb 2.2 oz 187 lb 2.7 oz  Weight (kg) 85.3 kg 86.7 kg 84.9 kg      Telemetry    Atrial fibrillation  with RVR, heart rate currently in the 130-1 140s.- Personally Reviewed   Physical Exam  Elderly woman, tachypneic, otherwise in no distress.  Able to speak without marked shortness of breath. GEN: No acute distress.   Neck: No JVD Cardiac:  Irregular, tachycardic, 2/6 systolic murmur heard throughout Respiratory:  Diminished in the bases bilaterally GI: Soft, nontender, non-distended  MS: No edema; No deformity. Neuro:  Nonfocal  Psych: Normal affect   Labs    High Sensitivity Troponin:   Recent Labs  Lab 01/08/2020 0537 01/20/2020 0708 01/03/20 0258  TROPONINIHS 3,712* 4,700* 1,581*      Chemistry Recent Labs  Lab 01/03/20 0258 01/04/20 0149 01/04/20 0549 01/05/20 1630 01/06/20 0507  NA 137   < > 137 136 138  K 4.1   < > 4.6 4.0 3.9  CL 97*   < > 95* 91* 91*  CO2 30   < > 30 34* 34*  GLUCOSE 227*   < > 143* 250* 125*  BUN 39*   < > 46* 59* 57*  CREATININE 2.23*   < > 2.52* 2.52* 2.34*  CALCIUM 8.8*   < > 8.7* 8.0* 8.0*  PROT 6.0*  --   --   --   --   ALBUMIN 3.1*  --   --   --   --   AST 35  --   --   --   --   ALT 22  --   --   --   --   ALKPHOS 62  --   --   --   --   BILITOT 0.6  --   --   --   --   GFRNONAA 19*   < > 17* 17* 18*  GFRAA 22*   < > 19* 19* 21*  ANIONGAP 10   < > 12 11 13    < > = values in this interval not displayed.     Hematology Recent Labs  Lab 01/04/20 0549 01/05/20 1748 01/06/20 0507  WBC 9.3 10.7* 8.8  RBC 4.29 3.91 3.61*  HGB 12.5 11.4* 10.5*  HCT 42.2 37.2 34.2*  MCV 98.4 95.1 94.7  MCH 29.1 29.2 29.1  MCHC 29.6* 30.6 30.7  RDW 13.3 12.8 12.9  PLT 207 243 212    BNP Recent Labs  Lab 01/06/2020 0537  BNP 649.0*     DDimer No results for input(s): DDIMER in the last 168 hours.   Radiology    DG CHEST PORT 1 VIEW  Addendum Date: 01/05/2020   ADDENDUM REPORT: 01/05/2020 16:52 ADDENDUM: The right upper extremity PICC line is visualized to the level of the mid SVC. Electronically Signed   By: Lovey Newcomer M.D.   On:  01/05/2020 16:52   Result Date: 01/05/2020 CLINICAL DATA:  PICC line placement. EXAM: PORTABLE CHEST 1 VIEW COMPARISON:  Chest radiograph 01/16/2020 FINDINGS: Right upper extremity PICC line is present with tip projecting over the superior vena cava. Stable enlarged cardiac and mediastinal contours. Bibasilar heterogeneous opacities. Possible small left pleural effusion. Thoracic spine degenerative changes. No pneumothorax. IMPRESSION: Right upper extremity PICC line tip projects over the superior vena cava. Bibasilar opacities may represent atelectasis. Possible small left pleural effusion. Electronically Signed: By: Lovey Newcomer M.D. On: 01/05/2020 16:18   [place] EKG SITE RITE  Result Date: 01/05/2020 If Site Rite image not attached, placement could not be confirmed due to current cardiac rhythm.   Cardiac Studies   2D echocardiogram 01/14/2020: 1. The entire apex is akinetic. Essentially the entire mid to distal  ventricle is hypokinetic, with normal function at the base. Pattern could  be consistent with stress induced cardiomyopathy/Takotsubo CM, cannot  exclude ischemic heart disease. . Left  ventricular ejection fraction, by estimation, is 25 to 30%. The left  ventricle has severely decreased function. The left ventricle demonstrates  regional wall motion abnormalities (see scoring diagram/findings for  description). Left ventricular diastolic  parameters are indeterminate.  2. RV poorly visualized, grossly appears enlarged with mild systolic  dysfunction. . Right ventricular systolic function was not well  visualized. The right ventricular size is not well visualized.  3. The mitral valve is normal in structure. Trivial mitral valve  regurgitation. No evidence of mitral stenosis.  4. Tricuspid valve regurgitation is moderate.  5. The aortic valve is tricuspid. Aortic valve regurgitation is not  visualized. No aortic stenosis is present.  6. Mild pulmonary HTN, PASP is 38 mmHg.    7. The inferior vena cava is normal in size with greater than 50%  respiratory variability, suggesting right atrial pressure of 3 mmHg.   Patient Profile     86 y.o. female with permanent atrial  fibrillation on long-term warfarin, chronic diastolic heart failure, type 2 diabetes, and obstructive sleep apnea with stage IV chronic kidney disease, presenting  Assessment & Plan    1.  Anterior MI: I think the patient has had a large infarct now complicated by cardiogenic shock.  She seems to be holding her own with stable blood pressure and renal function, but is requiring high level of support currently on IV milrinone and Lasix drips.  She is a DNR.  I am not sure what her endpoint is going to be.  We will continue current therapies for now.  Will transfer to a progressive bed as her drips are not actively being titrated and she is a DNR.  We will touch base with her daughter about her prognosis.  2.  Acute systolic heart failure/cardiogenic shock: Currently on IV milrinone.  Lasix drip continues.  She remains net fluid positive and her weights are stable.  She had 850 cc of urine output yesterday.  Will increase her Lasix infusion to 10 mg/h.  We will touch base with the advanced heart failure team to see if they have any other suggestions for her.  3.  Acute kidney injury: Creatinine stable at 2.34 mg/dL today.  Baseline creatinine in the 1.5 to 1.7 mg/dL range.  Continue current therapies.  Unable to add ACE/ARB/ARN I/Aldo antagonist  4.  Permanent atrial fibrillation: Amiodarone for rate control, continue heparin for now.  I am inclined to switch her from warfarin to apixaban at reduced dose 2.5 mg twice daily based on creatinine clearance and age.  Heart rate is uncontrolled.  We will try to add metoprolol 12.5 mg every 8 hours.  Not a candidate for digoxin with advanced kidney disease.  5.  Heart murmur: I am concerned about her elevated mixed venous oxygen saturation of 82% in the setting of  a heart murmur and large anterior infarction.  We will repeat limited echo to rule out postinfarction VSD.  The patient is critically ill with multiple organ systems failure and requires high complexity decision making for assessment and support, frequent evaluation and titration of therapies, application of advanced monitoring technologies and extensive interpretation of multiple databases.   Critical Care Time devoted to patient care services described in this note is 40 minutes   For questions or updates, please contact [place] Please consult [email or web address] for contact info under     Signed, Sherren Mocha, MD  01/06/2020, 10:07 AM    Addendum: Lengthy discussion with the patient's daughter.  We discussed her lack of progress.  As outlined above, she is critically ill and has already decided that she wants to be DNR.  Her daughter actually indicates that the patient may not want any further aggressive care and is probably ready for comfort care.  I went back in and spoke with the patient and she does wish for comfort care.  She does not want further life-prolonging measures as she understands the chance of her having a full recovery with good quality of life is exceedingly low at this point.  I think this is appropriate and will start to take her off of her IV infusions and move her out of the cardiovascular ICU to a palliative bed.  Formal palliative medicine consultation will be made.  This is all discussed at length with the patient who remains lucid and is clearly able to make decisions regarding her care.  Also discussed again with her daughter after I had the discussion with the patient.  Sherren Mocha 01/06/2020 11:04 AM

## 2020-01-07 DIAGNOSIS — I5033 Acute on chronic diastolic (congestive) heart failure: Secondary | ICD-10-CM

## 2020-01-07 MED ORDER — HYDROMORPHONE HCL 1 MG/ML IJ SOLN
1.0000 mg | INTRAMUSCULAR | Status: DC | PRN
  Administered 2020-01-08: 1 mg via INTRAVENOUS
  Filled 2020-01-07 (×2): qty 1

## 2020-01-07 NOTE — Social Work (Signed)
CSW acknowledging consult for comfort care. Will follow for disposition should hospice services or residential hospice become appropriate. Aware palliative following for stability.   Alexander Mt, MSW, [place] [facility]

## 2020-01-07 NOTE — Progress Notes (Signed)
Palliative Medicine RN Note: Symptom check.  Patient is alone in her room. She is awake and on the phone, on [place] [employer]. I entered and introduced myself as a Marine scientist; she declined to finish her call. She was very short of breath on the phone. I note that she is able to say only 2-3 words without dyspnea. She has not rec'd any Dilaudid.  I spoke with her RN Cynthia James. Cynthia James reports that she has slept on and off and that family has been in today. While Cynthia James does eat, all she eats is Lays chips and root beer, which will contribute to her dyspnea (confirmed she has Robinul ordered as well). I encouraged use of the Dilaudid with Cynthia James, and PMT will follow up tomorrow.   Cynthia James needs IV medication to manage her cardiac and terminal symptoms. Palliative team supports a referral to residential hospice, and this was introduced by Tacey Ruiz yesterday. We will re-assess tomorrow to see how stable she is for transfer and if referral remains appropriate.  Cynthia Skiff Emmry Hinsch, RN, BSN, [place] Medicine Team 01/07/2020 4:12 PM Office [phone]

## 2020-01-07 NOTE — Progress Notes (Addendum)
Progress Note  Patient Name: Cynthia James Date of Encounter: 01/07/2020  Primary Cardiologist: Dr. Gennette Pac [facility] Cardiology  Subjective   No chest pain. Some increase in SOB this afternoon, improved with propping up in bed.   Inpatient Medications    Scheduled Meds: . fluorometholone  1 drop Both Eyes q morning - 10a  . latanoprost  1 drop Both Eyes QHS  . mouth rinse  15 mL Mouth Rinse BID  . metoprolol tartrate  12.5 mg Oral Q8H  . mometasone-formoterol  2 puff Inhalation BID   Continuous Infusions:  PRN Meds: acetaminophen, acetaminophen, antiseptic oral rinse, bisacodyl, fentaNYL (SUBLIMAZE) injection, glycopyrrolate, LORazepam, ondansetron (ZOFRAN) IV, polyvinyl alcohol   Vital Signs    Vitals:   01/06/20 1200 01/06/20 1700 01/06/20 2124 01/07/20 0525  BP: (!) 104/43 122/66 119/70 112/63  Pulse: (!) 133 (!) 126 (!) 126 (!) 122  Resp: (!) 22 20 18 15   Temp:  98 F (36.7 C) 99.3 F (37.4 C) 98.8 F (37.1 C)  TempSrc:  Oral Oral   SpO2: 99% 98% 98% 99%  Weight:      Height:        Intake/Output Summary (Last 24 hours) at 01/07/2020 1426 Last data filed at 01/07/2020 0305 Gross per 24 hour  Intake 200 ml  Output 300 ml  Net -100 ml   Last 3 Weights 01/06/2020 01/05/2020 01/03/2020  Weight (lbs) 188 lb 0.8 oz 191 lb 2.2 oz 187 lb 2.7 oz  Weight (kg) 85.3 kg 86.7 kg 84.9 kg     Telemetry    N/A    Physical Exam   GEN: No acute distress, elderly WF HEENT: Normocephalic, atraumatic, sclera non-icteric. Neck: No JVD or bruits. Cardiac: Irregularly irregular, soft SEM, no rubs or gallops.  Radials/DP/PT 1+ and equal bilaterally.  Respiratory: Coarse throughout but preserved air movement with mild increase WOB GI: Soft, nontender, non-distended, BS +x 4. MS: no deformity. Extremities: No clubbing or cyanosis. Trace bilateral edema. Distal pedal pulses are 2+ and equal bilaterally. Neuro:  AAOx3. Follows commands. Psych:  Responds to questions  appropriately with a normal affect.  Labs    High Sensitivity Troponin:   Recent Labs  Lab 01/06/2020 0537 01/01/2020 0708 01/03/20 0258  TROPONINIHS 3,712* 4,700* 1,581*      Cardiac EnzymesNo results for input(s): TROPONINI in the last 168 hours. No results for input(s): TROPIPOC in the last 168 hours.   Chemistry Recent Labs  Lab 01/03/20 0258 01/04/20 0149 01/04/20 0549 01/05/20 1630 01/06/20 0507  NA 137   < > 137 136 138  K 4.1   < > 4.6 4.0 3.9  CL 97*   < > 95* 91* 91*  CO2 30   < > 30 34* 34*  GLUCOSE 227*   < > 143* 250* 125*  BUN 39*   < > 46* 59* 57*  CREATININE 2.23*   < > 2.52* 2.52* 2.34*  CALCIUM 8.8*   < > 8.7* 8.0* 8.0*  PROT 6.0*  --   --   --   --   ALBUMIN 3.1*  --   --   --   --   AST 35  --   --   --   --   ALT 22  --   --   --   --   ALKPHOS 62  --   --   --   --   BILITOT 0.6  --   --   --   --  GFRNONAA 19*   < > 17* 17* 18*  GFRAA 22*   < > 19* 19* 21*  ANIONGAP 10   < > 12 11 13    < > = values in this interval not displayed.     Hematology Recent Labs  Lab 01/04/20 0549 01/05/20 1748 01/06/20 0507  WBC 9.3 10.7* 8.8  RBC 4.29 3.91 3.61*  HGB 12.5 11.4* 10.5*  HCT 42.2 37.2 34.2*  MCV 98.4 95.1 94.7  MCH 29.1 29.2 29.1  MCHC 29.6* 30.6 30.7  RDW 13.3 12.8 12.9  PLT 207 243 212    BNP Recent Labs  Lab 01/11/2020 0537  BNP 649.0*     DDimer No results for input(s): DDIMER in the last 168 hours.   Radiology    DG CHEST PORT 1 VIEW  Addendum Date: 01/05/2020   ADDENDUM REPORT: 01/05/2020 16:52 ADDENDUM: The right upper extremity PICC line is visualized to the level of the mid SVC. Electronically Signed   By: Lovey Newcomer M.D.   On: 01/05/2020 16:52   Result Date: 01/05/2020 CLINICAL DATA:  PICC line placement. EXAM: PORTABLE CHEST 1 VIEW COMPARISON:  Chest radiograph 01/21/2020 FINDINGS: Right upper extremity PICC line is present with tip projecting over the superior vena cava. Stable enlarged cardiac and mediastinal  contours. Bibasilar heterogeneous opacities. Possible small left pleural effusion. Thoracic spine degenerative changes. No pneumothorax. IMPRESSION: Right upper extremity PICC line tip projects over the superior vena cava. Bibasilar opacities may represent atelectasis. Possible small left pleural effusion. Electronically Signed: By: Lovey Newcomer M.D. On: 01/05/2020 16:18    Cardiac Studies   2D echocardiogram 01/01/2020: 1. The entire apex is akinetic. Essentially the entire mid to distal  ventricle is hypokinetic, with normal function at the base. Pattern could  be consistent with stress induced cardiomyopathy/Takotsubo CM, cannot  exclude ischemic heart disease. . Left  ventricular ejection fraction, by estimation, is 25 to 30%. The left  ventricle has severely decreased function. The left ventricle demonstrates  regional wall motion abnormalities (see scoring diagram/findings for  description). Left ventricular diastolic  parameters are indeterminate.  2. RV poorly visualized, grossly appears enlarged with mild systolic  dysfunction. . Right ventricular systolic function was not well  visualized. The right ventricular size is not well visualized.  3. The mitral valve is normal in structure. Trivial mitral valve  regurgitation. No evidence of mitral stenosis.  4. Tricuspid valve regurgitation is moderate.  5. The aortic valve is tricuspid. Aortic valve regurgitation is not  visualized. No aortic stenosis is present.  6. Mild pulmonary HTN, PASP is 38 mmHg.  7. The inferior vena cava is normal in size with greater than 50%  respiratory variability, suggesting right atrial pressure of 3 mmHg.    Patient Profile     86 y.o. female with permanent atrial fibrillation on long-term warfarin, chronic diastolic heart failure, type 2 diabetes, and obstructive sleep apnea with stage IV chronic kidney disease, presenting with anterior MI complicated by cardiogenic shock.  Assessment & Plan     1. Acute anterior MI, managed conservatively 2. Acute systolic CHF complicated by cardiogenic shock 3. Acute kidney injury 4. Permanent atrial fibrillation, on warfarin prior to admission 5. Heart murmur, echo deferred due to comfort measures purused 6. Normocytic anemia 7. Diabetes mellitus  At this point focus remains on comfort measures. Palliative care consulted yesterday and appreciate their input - they have discontinued non-essential further meds including insulin. Amiodarone, Lasix and milrinone drips were discontinued along with anticoagulation,  antiplatelets and telemetry. Patient reports feeling OK, no acute pain, but with mild increase in WOB this afternoon. Spoke with palliative medicine RN liaison. Fentanyl is currently ordered (chosen over morphine etc due to history of nausea and vomiting and current renal function) but this is known to be fairly short acting. They would advise trial of dilaudid instead which can be provided for both pain and dyspnea. We will offer. Will otherwise continue metoprolol. Updated nurse on med change. Palliative med nurse will also be by to check on patient this afternoon. We appreciate their input and assistance in the care of this patient.  For questions or updates, please contact [place] Please consult [email or web address] for contact info under Cardiology/STEMI.  Signed, Charlie Pitter, PA-C 01/07/2020, 2:26 PM    Patient seen, examined. Available data reviewed. Agree with findings, assessment, and plan as outlined by Melina Copa, PA-C.  The patient is independently interviewed and examined.  She is an elderly woman in no acute distress.  JVP is difficult to visualize, but appears moderately elevated.  Lung fields have diminished breath sounds in the bases.  Heart is tachycardic and irregular with no murmur gallop.  Abdomen is soft nontender, extremities have trace edema.  Skin is warm and dry with no rash.  The patient has transition to full comfort  care.  Her daughter and granddaughter are at the bedside.  She is not currently experiencing any pain.  Greatly appreciate support provided by the palliative medicine team.  We will follow their lead in her care.  If she remains stable, she may ultimately be transitioned home with hospice.  Sherren Mocha, M.D. 01/07/2020 4:04 PM

## 2020-01-08 DIAGNOSIS — I214 Non-ST elevation (NSTEMI) myocardial infarction: Principal | ICD-10-CM

## 2020-01-08 DIAGNOSIS — R531 Weakness: Secondary | ICD-10-CM

## 2020-01-08 MED ORDER — HYDROMORPHONE HCL 1 MG/ML IJ SOLN: 0.5000 mg | Freq: Two times a day (BID) | INTRAMUSCULAR | Status: DC

## 2020-01-08 NOTE — Progress Notes (Signed)
Manufacturing engineer [facility]) Hospital Liaison note.    Received request from [place] for family interest in [facility] (in [place]) or [facility] (in [place]).  Spoke with daughter Macky Lower to confirm interest and to initiate education about residential hospice.  Macky Lower was open to a bed at either facility, whichever opened up first.  Unfortunately, neither is able to offer a room today. Hospital Liaison will follow up tomorrow or sooner if a room becomes available.    Please do not hesitate to call with questions.    Thank you for the opportunity to participate in this patient's care.  Domenic Moras, BSN, RN [facility] Hospital Liaison (listed on AMION under Hospice/[facility])    [phone]   (24h on call)

## 2020-01-08 NOTE — Social Work (Signed)
Referral made to [facility] liaison [facility], ( home located in [place]) for hospice home bed availability/discuss hospice home.   Westley Hummer, MSW, [place] Work

## 2020-01-08 NOTE — Progress Notes (Addendum)
Progress Note  Patient Name: Cynthia James Date of Encounter: 01/08/2020  Primary Cardiologist: Dr. Gennette Pac [facility] Cardiology  Subjective   Denies any pain. Reports some SOB. Unk Pinto at bedside. States, "I just want to go to sleep."  Inpatient Medications    Scheduled Meds: . fluorometholone  1 drop Both Eyes q morning - 10a  . latanoprost  1 drop Both Eyes QHS  . mouth rinse  15 mL Mouth Rinse BID  . metoprolol tartrate  12.5 mg Oral Q8H  . mometasone-formoterol  2 puff Inhalation BID   Continuous Infusions:  PRN Meds: acetaminophen, acetaminophen, antiseptic oral rinse, bisacodyl, glycopyrrolate, HYDROmorphone (DILAUDID) injection, LORazepam, ondansetron (ZOFRAN) IV, polyvinyl alcohol   Vital Signs    Vitals:   01/07/20 0525 01/07/20 1446 01/07/20 2007 01/08/20 0511  BP: 112/63 130/73  (!) 144/87  Pulse: (!) 122   (!) 111  Resp: 15   14  Temp: 98.8 F (37.1 C)   97.7 F (36.5 C)  TempSrc:      SpO2: 99%  96% 99%  Weight:      Height:        Intake/Output Summary (Last 24 hours) at 01/08/2020 0807 Last data filed at 01/08/2020 0511 Gross per 24 hour  Intake 90 ml  Output 800 ml  Net -710 ml   Last 3 Weights 01/06/2020 01/05/2020 01/03/2020  Weight (lbs) 188 lb 0.8 oz 191 lb 2.2 oz 187 lb 2.7 oz  Weight (kg) 85.3 kg 86.7 kg 84.9 kg     Telemetry    N/A  Physical Exam   GEN: No acute distress.  HEENT: Normocephalic, atraumatic, sclera non-icteric. Mild sweating above upper lip Neck: No JVD or bruits. Cardiac: Irregularly irregular, tachycardic, no murmurs, rubs, or gallops.  Radials/DP/PT 1+ and equal bilaterally.  Respiratory: Coarse BS bilaterally. Breathing is unlabored. Did not request patient to sit up to auscultate due to comfort GI: Soft, nontender, non-distended, BS +x 4. MS: no deformity. Extremities: No clubbing or cyanosis. 1+ bilateral LE edema. Distal pedal pulses are 2+ and equal bilaterally. Neuro:  Eyes closed, responds to  questions appropriately, follows commands. Psych: Reserved affect  Labs    High Sensitivity Troponin:   Recent Labs  Lab 01/04/2020 0537 01/10/2020 0708 01/03/20 0258  TROPONINIHS 3,712* 4,700* 1,581*      Cardiac EnzymesNo results for input(s): TROPONINI in the last 168 hours. No results for input(s): TROPIPOC in the last 168 hours.   Chemistry Recent Labs  Lab 01/03/20 0258 01/04/20 0149 01/04/20 0549 01/05/20 1630 01/06/20 0507  NA 137   < > 137 136 138  K 4.1   < > 4.6 4.0 3.9  CL 97*   < > 95* 91* 91*  CO2 30   < > 30 34* 34*  GLUCOSE 227*   < > 143* 250* 125*  BUN 39*   < > 46* 59* 57*  CREATININE 2.23*   < > 2.52* 2.52* 2.34*  CALCIUM 8.8*   < > 8.7* 8.0* 8.0*  PROT 6.0*  --   --   --   --   ALBUMIN 3.1*  --   --   --   --   AST 35  --   --   --   --   ALT 22  --   --   --   --   ALKPHOS 62  --   --   --   --   BILITOT 0.6  --   --   --   --  GFRNONAA 19*   < > 17* 17* 18*  GFRAA 22*   < > 19* 19* 21*  ANIONGAP 10   < > 12 11 13    < > = values in this interval not displayed.     Hematology Recent Labs  Lab 01/04/20 0549 01/05/20 1748 01/06/20 0507  WBC 9.3 10.7* 8.8  RBC 4.29 3.91 3.61*  HGB 12.5 11.4* 10.5*  HCT 42.2 37.2 34.2*  MCV 98.4 95.1 94.7  MCH 29.1 29.2 29.1  MCHC 29.6* 30.6 30.7  RDW 13.3 12.8 12.9  PLT 207 243 212    BNP Recent Labs  Lab 01/20/2020 0537  BNP 649.0*     DDimer No results for input(s): DDIMER in the last 168 hours.   Radiology    No results found.  Cardiac Studies   2D echocardiogram 12/31/2019: 1. The entire apex is akinetic. Essentially the entire mid to distal  ventricle is hypokinetic, with normal function at the base. Pattern could  be consistent with stress induced cardiomyopathy/Takotsubo CM, cannot  exclude ischemic heart disease. . Left  ventricular ejection fraction, by estimation, is 25 to 30%. The left  ventricle has severely decreased function. The left ventricle demonstrates  regional wall  motion abnormalities (see scoring diagram/findings for  description). Left ventricular diastolic  parameters are indeterminate.  2. RV poorly visualized, grossly appears enlarged with mild systolic  dysfunction. . Right ventricular systolic function was not well  visualized. The right ventricular size is not well visualized.  3. The mitral valve is normal in structure. Trivial mitral valve  regurgitation. No evidence of mitral stenosis.  4. Tricuspid valve regurgitation is moderate.  5. The aortic valve is tricuspid. Aortic valve regurgitation is not  visualized. No aortic stenosis is present.  6. Mild pulmonary HTN, PASP is 38 mmHg.  7. The inferior vena cava is normal in size with greater than 50%  respiratory variability, suggesting right atrial pressure of 3 mmHg.   Patient Profile     86 y.o. female with permanent atrial fibrillation on long-term warfarin, chronic diastolic heart failure, type 2 diabetes, and obstructive sleep apnea with stage IV chronic kidney disease, presenting with anterior MI complicated by cardiogenic shock.  Assessment & Plan    1. Acute anterior MI, managed conservatively 2. Acute systolic CHF complicated by cardiogenic shock 3. Acute kidney injury 4. Permanent atrial fibrillation, on warfarin prior to admission 5. Heart murmur, echo deferred due to comfort measures purused 6. Normocytic anemia 7. Diabetes mellitus  Focus remains on comfort measures. Drips and non-comfort-related meds were discontinued 01/06/20. Appreciate palliative medicine assistance. She denies any pain but does continue to have some SOB. Per discussion with PM yesterday, recommended to offer dilaudid over fentanyl. I do not see she has gotten any. Given dyspnea this morning have placed care order to nurse to offer to patient. Otherwise continue metoprolol. Palliative med team following for potential transfer to residential hospice as situation evolves.  For questions or updates,  please contact [place] Please consult [email or web address] for contact info under Cardiology/STEMI.  Signed, Charlie Pitter, PA-C 01/08/2020, 8:07 AM    Patient seen, examined. Available data reviewed. Agree with findings, assessment, and plan as outlined by Melina Copa, PA-C. Full comfort measures. Reviewed palliative care notes and appreciate care of their team. Talked about disposition with family at bedside. Patient is comfortable, sleeping a lot, feels 'tired.' Support provided to family. Will recheck in am.  Sherren Mocha, M.D. 01/08/2020 2:34 PM

## 2020-01-08 NOTE — Progress Notes (Signed)
Patient was tachypneic and feeling short of breath although bilateral lung sounds were clear to auscultation. Gave patient Dilaudid 1mg  for shortness of breath per PRN order. Shortly after administration, patient became agitated in the bed. She appeared restless, as noted by repositioning her arms and legs frequently and moaning. Agitation resolved after 20 min and patient appeared to rest comfortably. Patient was closely monitored by RN in room during period of agitation.

## 2020-01-08 NOTE — Progress Notes (Addendum)
Daily Progress Note   Patient Name: Cynthia James       Date: 01/08/2020 DOB: 1929/07/09  Age: 86 y.o. MRN#: [id number] Attending Physician: [place]ique, Peter M, MD Primary Care Physician: Abran Richard, MD Admit Date: 01/08/2020  Reason for Consultation/Follow-up: Terminal Care  Subjective:  Ms Gorey continues to feel poorly, admits to generalized fatigue, some intermittent shortness of breath, anxiety and pain. Daughter at bedside, great grand daughter who lives in [place] is also in town to visit with the patient. The patient states that she saw her after 12 years and that she is happy to visit with her. Patient is frail, appears pale, she is not able to speak in full sentences for long.   Length of Stay: 6  Current Medications: Scheduled Meds:  . fluorometholone  1 drop Both Eyes q morning - 10a  . latanoprost  1 drop Both Eyes QHS  . mouth rinse  15 mL Mouth Rinse BID  . metoprolol tartrate  12.5 mg Oral Q8H  . mometasone-formoterol  2 puff Inhalation BID    Continuous Infusions:   PRN Meds: acetaminophen, acetaminophen, antiseptic oral rinse, bisacodyl, glycopyrrolate, HYDROmorphone (DILAUDID) injection, LORazepam, ondansetron (ZOFRAN) IV, polyvinyl alcohol  Physical Exam         Generalized weakness Mildly dyspneic and anxious appearing Irregular Coarse breath sounds Regular work of breathing Abdomen is not distended Has some trace edema Extremities are warm to touch, no coolness no mottling detected.  Awake alert, answers and responds appropriately.   Vital Signs: BP (!) 144/87 (BP Location: Left Arm)   Pulse (!) 111   Temp 97.7 F (36.5 C)   Resp 14   Ht 5' (1.524 m)   Wt 85.3 kg   SpO2 99%   BMI 36.73 kg/m  SpO2: SpO2: 99 % O2 Device: O2 Device: Nasal  Cannula O2 Flow Rate: O2 Flow Rate (L/min): 5 L/min  Intake/output summary:   Intake/Output Summary (Last 24 hours) at 01/08/2020 1041 Last data filed at 01/08/2020 0511 Gross per 24 hour  Intake 70 ml  Output 800 ml  Net -730 ml   LBM: Last BM Date: 01/01/20 Baseline Weight: Weight: 83.5 kg Most recent weight: Weight: 85.3 kg       Palliative Assessment/Data: PPS 30%     Patient Active Problem List   Diagnosis  Date Noted  . Palliative care by specialist   . Goals of care, counseling/discussion   . DNR (do not resuscitate)   . Terminal care   . NSTEMI (non-ST elevated myocardial infarction) ([place]) 12/28/2019  . Acute on chronic diastolic CHF (congestive heart failure) ([place]) 01/14/2020  . Anticoagulated on warfarin   . Acute diastolic CHF (congestive heart failure) ([place]) 10/24/2019  . Atrial fibrillation, chronic ([place]) 10/24/2019  . Acute respiratory failure with hypoxia ([place]) 10/24/2019  . CKD (chronic kidney disease) stage 3, GFR 30-59 ml/min 10/24/2019  . Dyspnea 10/23/2019  . COPD (chronic obstructive pulmonary disease) ([place]) 10/23/2019  . OSA on CPAP 10/23/2019  . Atrial fibrillation ([place]) 08/22/2012  . Essential hypertension, benign 08/22/2012  . Diabetes ([place]) 08/22/2012  . Pure hypercholesterolemia 08/22/2012    Palliative Care Assessment & Plan   Patient Profile:  86 y.o. female with permanent atrial fibrillation on long-term warfarin, chronic diastolic heart failure, type 2 diabetes, and obstructive sleep apnea with stage IV chronic kidney disease, presenting with anterior MI complicated by cardiogenic shock.  Acute anterior MI, managed conservatively  Acute systolic CHF complicated by cardiogenic shock  Acute kidney injury   Permanent atrial fibrillation, on warfarin prior to admission   Heart murmur, echo deferred due to comfort measures purused   Normocytic anemia   Diabetes mellitus  Assessment:  PPS 20% Generalized weakness Fatigue Shortness of  breath Generalized pain Anxiety Air hunger Debility.   Recommendations/Plan:   overall goals of care remain for continuation of comfort measures as a singular focus. To this end, we discussed about next steps in symptom management as well as disposition options. Introduced about hospice philosophy of care, specifically the type of care that can be provided in residential hospice. Patient is from [facility] [place]. Patient's great grand daughter who is visiting from [place] is concerned about the patient tolerating the ride to the local hospice house. We discussed about pros and cons. At this time, family in agreement to proceed with [facility] consult for further discussions regarding residential hospice transfer. End of life signs and symptoms discussed with patient and family at bedside. Med history reviewed.   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 She likes music.  Goals of Care and Additional Recommendations:  Limitations on Scope of Treatment: Full Comfort Care  Code Status:    Code Status Orders  (From admission, onward)         Start     Ordered   01/06/20 1553  Do not attempt resuscitation (DNR)  Continuous       Question Answer Comment  In the event of cardiac or respiratory ARREST Do not call a "code blue"   In the event of cardiac or respiratory ARREST Do not perform Intubation, CPR, defibrillation or ACLS   In the event of cardiac or respiratory ARREST Use medication by any route, position, wound care, and other measures to relive pain and suffering. May use oxygen, suction and manual treatment of airway obstruction as needed for comfort.      01/06/20 1554        Code Status History    Date Active Date Inactive Code Status Order ID Comments User Context   01/04/2020 0855 01/06/2020 1554 DNR [id number]  Minus Breeding, MD Inpatient   01/14/2020 1023 01/04/2020 0855 Full Code  [id number]  Orson Eva, MD ED   10/23/2019 1841 10/27/2019 1906 Full  Code [id number]  Bethena Roys, MD Inpatient   Advance Care Planning Activity       Prognosis:   < 2 weeks  Discharge Planning:  Hospice facility  Care plan was discussed with  Patient, daughter, great grand daughter.   Thank you for allowing the Palliative Medicine Team to assist in the care of this patient.   Time In: 10 Time Out: 10.35 Total Time 35 Prolonged Time Billed No.       Greater than 50%  of this time was spent counseling and coordinating care related to the above assessment and plan.  Loistine Chance, MD  Please contact Palliative Medicine Team phone at [phone] for questions and concerns.

## 2020-01-09 DIAGNOSIS — R52 Pain, unspecified: Secondary | ICD-10-CM

## 2020-01-09 DIAGNOSIS — Z515 Encounter for palliative care: Secondary | ICD-10-CM

## 2020-01-09 MED ORDER — ACETAMINOPHEN 10 MG/ML IV SOLN
1000.0000 mg | Freq: Three times a day (TID) | INTRAVENOUS | Status: AC
  Administered 2020-01-09 – 2020-01-10 (×3): 1000 mg via INTRAVENOUS
  Filled 2020-01-09 (×3): qty 100

## 2020-01-09 MED ORDER — MORPHINE SULFATE (PF) 2 MG/ML IV SOLN
1.0000 mg | INTRAVENOUS | Status: DC | PRN
  Administered 2020-01-09 – 2020-01-12 (×9): 1 mg via INTRAVENOUS
  Filled 2020-01-09 (×9): qty 1

## 2020-01-09 NOTE — Progress Notes (Signed)
Progress Note  Patient Name: Cynthia James Date of Encounter: 01/09/2020  [facility] Cardiologist: No primary care provider on file.   Subjective   Minimally verbal this am.  Family at bedside.  Inpatient Medications    Scheduled Meds: . fluorometholone  1 drop Both Eyes q morning - 10a  . latanoprost  1 drop Both Eyes QHS  . mouth rinse  15 mL Mouth Rinse BID  . metoprolol tartrate  12.5 mg Oral Q8H  . mometasone-formoterol  2 puff Inhalation BID   Continuous Infusions: . acetaminophen 1,000 mg (01/09/20 1032)   PRN Meds: acetaminophen, acetaminophen, antiseptic oral rinse, bisacodyl, glycopyrrolate, LORazepam, morphine injection, ondansetron (ZOFRAN) IV, polyvinyl alcohol   Vital Signs    Vitals:   01/08/20 0511 01/08/20 1442 01/09/20 0415 01/09/20 0843  BP: (!) 144/87 120/72 (!) 144/100   Pulse: (!) 111 (!) 101 (!) 107   Resp: 14  12   Temp: 97.7 F (36.5 C)  99.3 F (37.4 C)   TempSrc:   Oral   SpO2: 99%  96% 93%  Weight:      Height:        Intake/Output Summary (Last 24 hours) at 01/09/2020 1132 Last data filed at 01/08/2020 1901 Gross per 24 hour  Intake --  Output 500 ml  Net -500 ml   Last 3 Weights 01/06/2020 01/05/2020 01/03/2020  Weight (lbs) 188 lb 0.8 oz 191 lb 2.2 oz 187 lb 2.7 oz  Weight (kg) 85.3 kg 86.7 kg 84.9 kg      Telemetry    Patient not on telemetry   Physical Exam  Elderly woman, somnolent, no distress GEN: No acute distress.   Neck: No JVD Cardiac: RRR, 2/6 systolic murmur heard throughout Respiratory:  Diminished breath sounds bilaterally GI: Soft, nontender, non-distended  MS:  Mild diffuse edema; No deformity. Neuro:  Nonfocal  Psych: Normal affect   Labs    High Sensitivity Troponin:   Recent Labs  Lab 01/20/2020 0537 12/31/2019 0708 01/03/20 0258  TROPONINIHS 3,712* 4,700* 1,581*      Chemistry Recent Labs  Lab 01/03/20 0258 01/04/20 0149 01/04/20 0549 01/05/20 1630 01/06/20 0507  NA 137   <  > 137 136 138  K 4.1   < > 4.6 4.0 3.9  CL 97*   < > 95* 91* 91*  CO2 30   < > 30 34* 34*  GLUCOSE 227*   < > 143* 250* 125*  BUN 39*   < > 46* 59* 57*  CREATININE 2.23*   < > 2.52* 2.52* 2.34*  CALCIUM 8.8*   < > 8.7* 8.0* 8.0*  PROT 6.0*  --   --   --   --   ALBUMIN 3.1*  --   --   --   --   AST 35  --   --   --   --   ALT 22  --   --   --   --   ALKPHOS 62  --   --   --   --   BILITOT 0.6  --   --   --   --   GFRNONAA 19*   < > 17* 17* 18*  GFRAA 22*   < > 19* 19* 21*  ANIONGAP 10   < > 12 11 13    < > = values in this interval not displayed.     Hematology Recent Labs  Lab 01/04/20 0549 01/05/20 1748 01/06/20 0507  WBC  9.3 10.7* 8.8  RBC 4.29 3.91 3.61*  HGB 12.5 11.4* 10.5*  HCT 42.2 37.2 34.2*  MCV 98.4 95.1 94.7  MCH 29.1 29.2 29.1  MCHC 29.6* 30.6 30.7  RDW 13.3 12.8 12.9  PLT 207 243 212    BNPNo results for input(s): BNP, PROBNP in the last 168 hours.   DDimer No results for input(s): DDIMER in the last 168 hours.   Radiology    No results found.   Patient Profile     86 y.o. female withpermanent atrial fibrillation on long-term warfarin, chronic diastolic heart failure, type 2 diabetes, and obstructive sleep apnea with stage IV chronic kidney disease, presentingwith anterior MI complicated by cardiogenic shock.  Assessment & Plan    1. Acute anterior MI, managed conservatively 2. Acute systolic CHF complicated by cardiogenic shock 3. Acute kidney injury 4. Permanent atrial fibrillation, on warfarin prior to admission 5. Heart murmur, echo deferred due to comfort measures purused 6. Normocytic anemia 7. Diabetes mellitus  Patient on full comfort care.  Appreciate care of the palliative medicine team.  Her only complaints overnight have been that of a dry mouth.  She is no longer able to swallow without coughing so the patient has been made n.p.o.  She clearly has deteriorated over the last 24 hours as she is much less responsive today.  Discussed  ongoing comfort measures with her family.  Support provided.  For questions or updates, please contact [place] Please consult [email or web address] for contact info under        Signed, Sherren Mocha, MD  01/09/2020, 11:32 AM

## 2020-01-09 NOTE — Progress Notes (Signed)
[facility] Documentation  Bed offered at hospice home in [place] and pt's family declined.   Please let [facility] know if we can be of further assistance.   Thank you,  Freddie Breech, RN  [facility] Liaison [phone]

## 2020-01-09 NOTE — Progress Notes (Signed)
Daily Progress Note   Patient Name: Cynthia James       Date: 01/09/2020 DOB: 01-Dec-1929  Age: 86 y.o. MRN#: [id number] Attending Physician: Martinique, Peter M, MD Primary Care Physician: Abran Richard, MD Admit Date: 12/22/2019  Reason for Consultation/Follow-up: Terminal Care  Subjective:  Cynthia James is less alert, more weak appearing, PO intake is minimal and prn symptom management needs are increasing.  Daughter great grand daughter and several friends/family are at bedside holding vigil. See below.     Length of Stay: 7  Current Medications: Scheduled Meds:  . fluorometholone  1 drop Both Eyes q morning - 10a  . latanoprost  1 drop Both Eyes QHS  . mouth rinse  15 mL Mouth Rinse BID  . metoprolol tartrate  12.5 mg Oral Q8H  . mometasone-formoterol  2 puff Inhalation BID    Continuous Infusions: . acetaminophen 1,000 mg (01/09/20 1032)    PRN Meds: acetaminophen, acetaminophen, antiseptic oral rinse, bisacodyl, glycopyrrolate, LORazepam, morphine injection, ondansetron (ZOFRAN) IV, polyvinyl alcohol  Physical Exam         Generalized weakness Mildly dyspneic and anxious appearing Irregular Coarse breath sounds Regular work of breathing Abdomen is not distended Has some trace edema Extremities are warm to touch, no coolness no mottling detected.  Less alert today  Vital Signs: BP (!) 144/100 (BP Location: Left Arm)   Pulse (!) 107   Temp 99.3 F (37.4 C) (Oral)   Resp 12   Ht 5' (1.524 m)   Wt 85.3 kg   SpO2 93%   BMI 36.73 kg/m  SpO2: SpO2: 93 % O2 Device: O2 Device: Nasal Cannula O2 Flow Rate: O2 Flow Rate (L/min): 5 L/min  Intake/output summary:   Intake/Output Summary (Last 24 hours) at 01/09/2020 1436 Last data filed at 01/08/2020 1901 Gross per  24 hour  Intake --  Output 200 ml  Net -200 ml   LBM: Last BM Date: 01/01/20 Baseline Weight: Weight: 83.5 kg Most recent weight: Weight: 85.3 kg       Palliative Assessment/Data: PPS 30%     Patient Active Problem List   Diagnosis Date Noted  . Palliative care by specialist   . Goals of care, counseling/discussion   . DNR (do not resuscitate)   . Terminal care   . NSTEMI (non-ST elevated myocardial  infarction) ([place]) 01/17/2020  . Acute on chronic diastolic CHF (congestive heart failure) ([place]) 01/04/2020  . Anticoagulated on warfarin   . Acute diastolic CHF (congestive heart failure) ([place]) 10/24/2019  . Atrial fibrillation, chronic ([place]) 10/24/2019  . Acute respiratory failure with hypoxia ([place]) 10/24/2019  . CKD (chronic kidney disease) stage 3, GFR 30-59 ml/min 10/24/2019  . Dyspnea 10/23/2019  . COPD (chronic obstructive pulmonary disease) ([place]) 10/23/2019  . OSA on CPAP 10/23/2019  . Atrial fibrillation ([place]) 08/22/2012  . Essential hypertension, benign 08/22/2012  . Diabetes ([place]) 08/22/2012  . Pure hypercholesterolemia 08/22/2012    Palliative Care Assessment & Plan   Patient Profile:  86 y.o. female with permanent atrial fibrillation on long-term warfarin, chronic diastolic heart failure, type 2 diabetes, and obstructive sleep apnea with stage IV chronic kidney disease, presenting with anterior MI complicated by cardiogenic shock.  Acute anterior MI, managed conservatively  Acute systolic CHF complicated by cardiogenic shock  Acute kidney injury   Permanent atrial fibrillation, on warfarin prior to admission   Heart murmur, echo deferred due to comfort measures purused   Normocytic anemia   Diabetes mellitus  Assessment:  PPS 20% Generalized weakness Fatigue Shortness of breath Generalized pain Anxiety Air hunger Debility.   Recommendations/Plan:   overall goals of care remain for continuation of comfort measures as a singular focus. Continue  current pain and non pain symptom management options  Family to be provided with "gone from my sight" and "hard choices" booklet for further information on end of life signs and symptoms. At bedside today, we discussed about common symptoms at end of life and management options. Family is most comfortable with continuing end of life care over here, not in favor of hospice transfer.   Anticipated hospital death  Prognosis likely few days at this point, in my opinion.  Goals of Care and Additional Recommendations:  Limitations on Scope of Treatment: Full Comfort Care  Code Status:    Code Status Orders  (From admission, onward)         Start     Ordered   01/06/20 1553  Do not attempt resuscitation (DNR)  Continuous       Question Answer Comment  In the event of cardiac or respiratory ARREST Do not call a "code blue"   In the event of cardiac or respiratory ARREST Do not perform Intubation, CPR, defibrillation or ACLS   In the event of cardiac or respiratory ARREST Use medication by any route, position, wound care, and other measures to relive pain and suffering. May use oxygen, suction and manual treatment of airway obstruction as needed for comfort.      01/06/20 1554        Code Status History    Date Active Date Inactive Code Status Order ID Comments User Context   01/04/2020 0855 01/06/2020 1554 DNR [id number]  Minus Breeding, MD Inpatient   12/28/2019 1023 01/04/2020 0855 Full Code [id number]  Orson Eva, MD ED   10/23/2019 1841 10/27/2019 1906 Full Code [id number]  Bethena Roys, MD Inpatient   Advance Care Planning Activity       Prognosis:   days   Discharge Planning:  Anticipated hospital death.   Care plan was discussed with  Patient, daughter, great grand daughter.  RN, hospice liaisons and also with Dr Burt Knack. Appreciate the inter disclipinary approach to her care and everyone's input and recommendations.   Thank you for allowing the Palliative Medicine  Team to assist in the care of  this patient.   Time In: 1400 Time Out: 1435 Total Time 35 Prolonged Time Billed No.       Greater than 50%  of this time was spent counseling and coordinating care related to the above assessment and plan.  Loistine Chance, MD  Please contact Palliative Medicine Team phone at [phone] for questions and concerns.

## 2020-01-10 NOTE — Progress Notes (Signed)
   Palliative Medicine Inpatient Follow Up Note   HPI: 86 y.o.femalewithpermanent atrial fibrillation on long-term warfarin, chronic diastolic heart failure, type 2 diabetes, and obstructive sleep apnea with stage IV chronic kidney disease, presentingwith anterior MI complicated by cardiogenic shock.  Today's Discussion (01/10/2020): Chart reviewed. I met with patients daughter, Cynthia James at bedside. We discussed her present state. Cynthia James shares that she is more calm today. She intermittently wakes up delirious and asks for water. Otherwise she has been sleeping much of the day.   We talked about Cynthia James's dogs at home and how one of them in particular has a strong bond with Cynthia James. We discussed that it will be difficult for "Shiba" when Cynthia James does not come home.   Ongoing support provided.  Questions and concerns addressed.  SUMMARY OF RECOMMENDATIONS   DNAR/DNI  Continue comfort measures - anticipate days  Ongoing outside spiritual support - patient is a Jehovah's witness    Medication per [facility]   Discharge will be celestial   Time Spent: 25 Greater than 50% of the time was spent in counseling and coordination of care ______________________________________________________________________________________ [place] Team Team Cell Phone: [phone] Please utilize secure chat with additional questions, if there is no response within 30 minutes please call the above phone number  Palliative Medicine Team providers are available by phone from 7am to 7pm daily and can be reached through the team cell phone.  Should this patient require assistance outside of these hours, please call the patient's attending physician.

## 2020-01-10 NOTE — Progress Notes (Signed)
Progress Note  Patient Name: Cynthia James Date of Encounter: 01/10/2020  [facility] Cardiologist: No primary care provider on file.   Subjective   Denies pain. Limited history obtainable.   Inpatient Medications    Scheduled Meds: . fluorometholone  1 drop Both Eyes q morning - 10a  . latanoprost  1 drop Both Eyes QHS  . mouth rinse  15 mL Mouth Rinse BID  . metoprolol tartrate  12.5 mg Oral Q8H  . mometasone-formoterol  2 puff Inhalation BID   Continuous Infusions:  PRN Meds: acetaminophen, acetaminophen, antiseptic oral rinse, bisacodyl, glycopyrrolate, LORazepam, morphine injection, ondansetron (ZOFRAN) IV, polyvinyl alcohol   Vital Signs    Vitals:   01/08/20 1442 01/09/20 0415 01/09/20 0843 01/10/20 0617  BP: 120/72 (!) 144/100  116/65  Pulse: (!) 101 (!) 107  (!) 118  Resp:  12  16  Temp:  99.3 F (37.4 C)  97.7 F (36.5 C)  TempSrc:  Oral  Oral  SpO2:  96% 93% 98%  Weight:      Height:        Intake/Output Summary (Last 24 hours) at 01/10/2020 1210 Last data filed at 01/10/2020 1015 Gross per 24 hour  Intake 220 ml  Output 600 ml  Net -380 ml   Last 3 Weights 01/06/2020 01/05/2020 01/03/2020  Weight (lbs) 188 lb 0.8 oz 191 lb 2.2 oz 187 lb 2.7 oz  Weight (kg) 85.3 kg 86.7 kg 84.9 kg      Telemetry    Not on tele   Physical Exam  Elderly, somnolent GEN: No acute distress.   Neck: No JVD Cardiac: irregular 2/6 systolic murmur throughout Respiratory: diminished BS bilaterally. GI: Soft, nontender, non-distended  MS: mild diffuse edema; No deformity. Neuro:  Nonfocal  Psych: somnolent  Labs    High Sensitivity Troponin:   Recent Labs  Lab 01/01/2020 0537 01/03/2020 0708 01/03/20 0258  TROPONINIHS 3,712* 4,700* 1,581*      Chemistry Recent Labs  Lab 01/04/20 0549 01/05/20 1630 01/06/20 0507  NA 137 136 138  K 4.6 4.0 3.9  CL 95* 91* 91*  CO2 30 34* 34*  GLUCOSE 143* 250* 125*  BUN 46* 59* 57*  CREATININE 2.52* 2.52*  2.34*  CALCIUM 8.7* 8.0* 8.0*  GFRNONAA 17* 17* 18*  GFRAA 19* 19* 21*  ANIONGAP 12 11 13      Hematology Recent Labs  Lab 01/04/20 0549 01/05/20 1748 01/06/20 0507  WBC 9.3 10.7* 8.8  RBC 4.29 3.91 3.61*  HGB 12.5 11.4* 10.5*  HCT 42.2 37.2 34.2*  MCV 98.4 95.1 94.7  MCH 29.1 29.2 29.1  MCHC 29.6* 30.6 30.7  RDW 13.3 12.8 12.9  PLT 207 243 212    BNPNo results for input(s): BNP, PROBNP in the last 168 hours.   DDimer No results for input(s): DDIMER in the last 168 hours.   Radiology    No results found.   Patient Profile     86 y.o. female permanent atrial fibrillation on long-term warfarin, chronic diastolic heart failure, type 2 diabetes, and obstructive sleep apnea with stage IV chronic kidney disease, presentingwith anterior MI complicated by cardiogenic shock.  Assessment & Plan    Full comfort measures. Patient does not appear to be in any distress. Have communicated with palliative care team over the last 24 hours. Plans for now to keep the patient here until she passes. Spoke with family - not much clinical change from my evaluation yesterday. Difficult to know timing of her  death.  For questions or updates, please contact [place] Please consult [email or web address] for contact info under     Signed, Sherren Mocha, MD  01/10/2020, 12:10 PM

## 2020-01-11 NOTE — Progress Notes (Signed)
Progress Note  Patient Name: KAITLIN ALCINDOR Date of Encounter: 01/11/2020  [facility] Cardiologist: No primary care provider on file.   Subjective   Resting comfortably. Sleeping. No history obtainable.   Inpatient Medications    Scheduled Meds: . fluorometholone  1 drop Both Eyes q morning - 10a  . latanoprost  1 drop Both Eyes QHS  . mouth rinse  15 mL Mouth Rinse BID  . metoprolol tartrate  12.5 mg Oral Q8H  . mometasone-formoterol  2 puff Inhalation BID   Continuous Infusions:  PRN Meds: acetaminophen, acetaminophen, antiseptic oral rinse, bisacodyl, glycopyrrolate, LORazepam, morphine injection, ondansetron (ZOFRAN) IV, polyvinyl alcohol   Vital Signs    Vitals:   01/09/20 0415 01/09/20 0843 01/10/20 0617 01/11/20 0608  BP: (!) 144/100  116/65 127/72  Pulse: (!) 107  (!) 118 (!) 101  Resp: 12  16 18   Temp: 99.3 F (37.4 C)  97.7 F (36.5 C) 98.6 F (37 C)  TempSrc: Oral  Oral Oral  SpO2: 96% 93% 98% 95%  Weight:      Height:        Intake/Output Summary (Last 24 hours) at 01/11/2020 1115 Last data filed at 01/11/2020 1024 Gross per 24 hour  Intake 0 ml  Output 50 ml  Net -50 ml   Last 3 Weights 01/06/2020 01/05/2020 01/03/2020  Weight (lbs) 188 lb 0.8 oz 191 lb 2.2 oz 187 lb 2.7 oz  Weight (kg) 85.3 kg 86.7 kg 84.9 kg      Telemetry    Not on tele   Physical Exam   GEN: no acute distress  HEENT: normal  Neck: no JVD, carotid bruits, or masses Cardiac: irregular  Respiratory:  Decreased bilaterally GI: soft, nontender, nondistended, + BS MS: no deformity or atrophy  Skin: warm and dry Neuro:  nonfocal Psych: somnolent  Labs    High Sensitivity Troponin:   Recent Labs  Lab 12/25/2019 0537 01/06/2020 0708 01/03/20 0258  TROPONINIHS 3,712* 4,700* 1,581*      Chemistry Recent Labs  Lab 01/05/20 1630 01/06/20 0507  NA 136 138  K 4.0 3.9  CL 91* 91*  CO2 34* 34*  GLUCOSE 250* 125*  BUN 59* 57*  CREATININE 2.52* 2.34*    CALCIUM 8.0* 8.0*  GFRNONAA 17* 18*  GFRAA 19* 21*  ANIONGAP 11 13     Hematology Recent Labs  Lab 01/05/20 1748 01/06/20 0507  WBC 10.7* 8.8  RBC 3.91 3.61*  HGB 11.4* 10.5*  HCT 37.2 34.2*  MCV 95.1 94.7  MCH 29.2 29.1  MCHC 30.6 30.7  RDW 12.8 12.9  PLT 243 212    BNPNo results for input(s): BNP, PROBNP in the last 168 hours.   DDimer No results for input(s): DDIMER in the last 168 hours.   Radiology    No results found.   Patient Profile     86 y.o. female permanent atrial fibrillation on long-term warfarin, chronic diastolic heart failure, type 2 diabetes, and obstructive sleep apnea with stage IV chronic kidney disease, presentingwith anterior MI complicated by cardiogenic shock.  Assessment & Plan    Full comfort measures at this time. Almalik Weissberg work to keep pain and anxiety under good control. Have discussed with family. No much change clinically from yesterday other that appearing to be anxious and in pain overnight. She Nelida Mandarino stay in the hospital until her death.  For questions or updates, please contact [place] Please consult [email or web address] for contact info under  Signed, Brianni Manthe Meredith Leeds, MD  01/11/2020, 11:15 AM

## 2020-01-11 NOTE — Plan of Care (Signed)
  Problem: Respiratory: Goal: Verbalizations of increased ease of respirations will increase Outcome: Progressing   Problem: Role Relationship: Goal: Family's ability to cope with current situation will improve Outcome: Progressing   Problem: Pain Management: Goal: Satisfaction with pain management regimen will improve Outcome: Progressing

## 2020-01-12 MED ORDER — MORPHINE SULFATE (PF) 2 MG/ML IV SOLN
1.0000 mg | INTRAVENOUS | Status: DC | PRN
  Administered 2020-01-12: 2 mg via INTRAVENOUS
  Filled 2020-01-12: qty 1

## 2020-01-15 ENCOUNTER — Telehealth: Payer: Self-pay | Admitting: Cardiology

## 2020-01-15 NOTE — Telephone Encounter (Signed)
     Seth Bake from [employer], she said [facility] hospital gave Dr. Martinique name who can sign pt's death certificate

## 2020-01-16 NOTE — Telephone Encounter (Signed)
I do not know this patient and I wasn't involved in her care. Looks like Gerrit Halls was her attending during her hospital stay  Nesbit Michon Martinique MD, [facility]

## 2020-01-16 NOTE — Telephone Encounter (Signed)
Follow up    Cynthia James is calling back from funeral home. She advised that the hospital referred her back to [place] to get the death certificate signed because the patient was consulted by one of our providers per the hospital. I see where Dr. Curt Bears inputted the death summary so seeing if he would sign the death certificate.

## 2020-01-17 NOTE — Telephone Encounter (Signed)
Death certificate placed in Dr. Francesca Oman folder to be signed next Tuesday 8/31, as confirmed by Dr. Meda Coffee. Once certificate complete, will give to Medical Records thereafter, to follow-up on getting this to the funeral home.

## 2020-01-17 NOTE — Telephone Encounter (Signed)
Call placed to [facility].  Per Thea Alken was advised by Zacarias Pontes to call our office.  This nurse made outreach to [facility].  Per patient placement, death certificate needs to be signed by Dr. Meda Coffee and it is office responsibility to get the death certificate.  MR will attempt to get death certificate.  Dr. Meda Coffee can sign next Tuesday when she is in office.

## 2020-01-17 NOTE — Telephone Encounter (Signed)
Cynthia James calling back from the funeral home. She states she has called multiple times and has not heard back about getting the patient's death certificate signed.

## 2020-01-21 NOTE — Telephone Encounter (Signed)
Will send this message to Dr. Meda Coffee as a general FYI.

## 2020-01-21 NOTE — Telephone Encounter (Signed)
Seth Bake with [facility] states they have not yet received fax and she is calling to follow up regarding death certificate. She states a call back will not be necessary unless our office has additional questions.

## 2020-01-22 NOTE — Telephone Encounter (Signed)
Spoke with the [facility] and they received the death certificate information Dr. Meda Coffee filled out this morning on this pt, but this was a faxed copy, not the certified copy.  The funeral home states they sent over the faxed copy for Dr. Meda Coffee to sign off on, so that the pt could go ahead and be cremated.  Per Seth Bake at the funeral home, they mailed the certified death certificate for Dr. Meda Coffee to sign off on, on last Saturday.  Per Seth Bake, we should be receiving the certified copy this week or early next week.  Seth Bake states she will need to fill this out as well, so that they can legally file this. Spoke with our Medical Records Rep Lorriane Shire, and she states that she typically gets the mailed certified death certificate when it arrives at the office, then she will place this in a folder and place in Dr. Francesca Oman mailbox for signature.  Once Dr. Meda Coffee signs final certified death certificate, this will be given back to [facility] in medical records to further follow-up and send back to the funeral home thereafter. Will send this message to Dr. Meda Coffee as a general FYI.

## 2020-01-22 NOTE — Telephone Encounter (Signed)
Death certificate was filled out by Dr. Meda Coffee and I will hand this off to Creta Levin in Medical Records to further follow-up on this and send to [facility].

## 2020-01-22 NOTE — Progress Notes (Addendum)
Progress Note  Patient Name: Cynthia James Date of Encounter: 2020-02-02  [facility] Cardiologist: No primary care provider on file.   Subjective   Sleeping, resting comfortably  Inpatient Medications    Scheduled Meds: . fluorometholone  1 drop Both Eyes q morning - 10a  . latanoprost  1 drop Both Eyes QHS  . mouth rinse  15 mL Mouth Rinse BID  . metoprolol tartrate  12.5 mg Oral Q8H  . mometasone-formoterol  2 puff Inhalation BID   Continuous Infusions:  PRN Meds: acetaminophen, acetaminophen, antiseptic oral rinse, bisacodyl, glycopyrrolate, LORazepam, morphine injection, ondansetron (ZOFRAN) IV, polyvinyl alcohol   Vital Signs    Vitals:   01/09/20 0843 01/10/20 0617 01/11/20 0608 01/11/20 2042  BP:  116/65 127/72 134/76  Pulse:  (!) 118 (!) 101 (!) 120  Resp:  16 18 17   Temp:  97.7 F (36.5 C) 98.6 F (37 C) 98.3 F (36.8 C)  TempSrc:  Oral Oral Oral  SpO2: 93% 98% 95% 94%  Weight:      Height:        Intake/Output Summary (Last 24 hours) at 02/02/2020 1123 Last data filed at 2020-02-02 0737 Gross per 24 hour  Intake 110 ml  Output 600 ml  Net -490 ml   Last 3 Weights 01/06/2020 01/05/2020 01/03/2020  Weight (lbs) 188 lb 0.8 oz 191 lb 2.2 oz 187 lb 2.7 oz  Weight (kg) 85.3 kg 86.7 kg 84.9 kg      Telemetry    Not on telemetry  Physical Exam   GEN: Well nourished, somnolent  HEENT: normal  Neck: no JVD, carotid bruits, or masses Cardiac: Tachycardic; no murmurs, rubs, or gallops,no edema  Respiratory: Breath sounds diminished bilaterally GI: soft, nontender, nondistended, + BS MS: no deformity or atrophy  Skin: warm and dry Neuro:  Strength and sensation are intact Psych: Somnolent   Labs    High Sensitivity Troponin:   Recent Labs  Lab 01/04/2020 0537 01/11/2020 0708 01/03/20 0258  TROPONINIHS 3,712* 4,700* 1,581*      Chemistry Recent Labs  Lab 01/05/20 1630 01/06/20 0507  NA 136 138  K 4.0 3.9  CL 91* 91*  CO2 34*  34*  GLUCOSE 250* 125*  BUN 59* 57*  CREATININE 2.52* 2.34*  CALCIUM 8.0* 8.0*  GFRNONAA 17* 18*  GFRAA 19* 21*  ANIONGAP 11 13     Hematology Recent Labs  Lab 01/05/20 1748 01/06/20 0507  WBC 10.7* 8.8  RBC 3.91 3.61*  HGB 11.4* 10.5*  HCT 37.2 34.2*  MCV 95.1 94.7  MCH 29.2 29.1  MCHC 30.6 30.7  RDW 12.8 12.9  PLT 243 212    BNPNo results for input(s): BNP, PROBNP in the last 168 hours.   DDimer No results for input(s): DDIMER in the last 168 hours.   Radiology    No results found.   Patient Profile     86 y.o. female permanent atrial fibrillation on long-term warfarin, chronic diastolic heart failure, type 2 diabetes, and obstructive sleep apnea with stage IV chronic kidney disease, presentingwith anterior MI complicated by cardiogenic shock.  Assessment & Plan    Non ST elevation MI with acute systolic heart failure: Patient is on full comfort measures at this time.  Or can her anxiety under control.  She had a good night last night without issue.  She wishes to stay in the hospital until death.    For questions or updates, please contact [facility] Please consult [email or web address] for  contact info under     Signed, Janalynn Eder Meredith Leeds, MD  2020-01-27, 11:23 AM

## 2020-01-22 NOTE — Death Summary Note (Signed)
DEATH SUMMARY   Patient Details  Name: Cynthia James MRN: [id number] DOB: September 03, 1929  Admission/Discharge Information   Admit Date:  2020/01/24  Date of Death: Date of Death: February 03, 2020  Time of Death: Time of Death: Aug 31, 1550  Length of Stay: 2022-08-22  Referring Physician: Abran Richard, MD   Reason(s) for Hospitalization  myocardial infarction, heart failure  Diagnoses  Preliminary cause of death:  Secondary Diagnoses (including complications and co-morbidities):  Active Problems:   Essential hypertension, benign   Atrial fibrillation, chronic (HCC)   CKD (chronic kidney disease) stage 3, GFR 30-59 ml/min   NSTEMI (non-ST elevated myocardial infarction) (HCC)   Acute on chronic diastolic CHF (congestive heart failure) ([place])   Palliative care by specialist   Goals of care, counseling/discussion   DNR (do not resuscitate)   Terminal care   Dying care   Generalized pain   Brief Hospital Course (including significant findings, care, treatment, and services provided and events leading to death)  Cynthia James is a 86 y.o. year old female who with a history of permanent atrial fibrillation, chronic diastolic heart failure, hypertension, hyperlipidemia, type 2 diabetes, obstructive sleep apnea, and CKD stage III presented to the hospital 01/24/20 with chest pain, found to have non-ST elevation MI.  She was in rapid atrial fibrillation as well.  Echo performed showed an ejection fraction of 25 to 30% with apical akinesis.  Due to her elevated INR, left heart catheterization was deferred.  Over the course of her hospitalization, she went into acute systolic heart failure.  Due to rising creatinine, left heart catheterization continued to be deferred.  She received IV diuresis, but continued to have issues with volume overload.  She was diuresed with Lasix and started on milrinone.  On day 4 of her hospitalization, palliative care was consulted due to worsening systolic heart failure  and patient wishes.  The patient determined that she wanted to forego aggressive measures.  She was thus transferred comfort measures.  On 02-03-20, the patient passed away peacefully at 15:52.    Pertinent Labs and Studies  Significant Diagnostic Studies DG CHEST PORT 1 VIEW  Addendum Date: 01/05/2020   ADDENDUM REPORT: 01/05/2020 16:52 ADDENDUM: The right upper extremity PICC line is visualized to the level of the mid SVC. Electronically Signed   By: Lovey Newcomer M.D.   On: 01/05/2020 16:52   Result Date: 01/05/2020 CLINICAL DATA:  PICC line placement. EXAM: PORTABLE CHEST 1 VIEW COMPARISON:  Chest radiograph 01-24-20 FINDINGS: Right upper extremity PICC line is present with tip projecting over the superior vena cava. Stable enlarged cardiac and mediastinal contours. Bibasilar heterogeneous opacities. Possible small left pleural effusion. Thoracic spine degenerative changes. No pneumothorax. IMPRESSION: Right upper extremity PICC line tip projects over the superior vena cava. Bibasilar opacities may represent atelectasis. Possible small left pleural effusion. Electronically Signed: By: Lovey Newcomer M.D. On: 01/05/2020 16:18   DG Chest Port 1 View  Result Date: 2020-01-24 CLINICAL DATA:  Shortness of breath. EXAM: PORTABLE CHEST 1 VIEW COMPARISON:  One-view chest x-ray 10/23/2019 FINDINGS: Heart is mildly enlarged, exaggerated by low lung volumes. Mild bibasilar opacities are noted. Vascular calcifications are present at the aortic arch. Degenerative changes are noted in the shoulders. IMPRESSION: 1. Borderline cardiomegaly without failure. 2. Mild bibasilar opacities likely reflect atelectasis. Electronically Signed   By: San Morelle M.D.   On: 2020/01/24 07:07   ECHOCARDIOGRAM COMPLETE  Result Date: 24-Jan-2020    ECHOCARDIOGRAM REPORT   Patient Name:   Cynthia James  [place] Date of Exam: 12/30/2019 Medical Rec #:  [id number]            Height:       60.0 in Accession #:    [id number]            Weight:       184.0 lb Date of Birth:  1929-12-02            BSA:          1.802 m Patient Age:    3 years             BP:           109/60 mmHg Patient Gender: F                    HR:           112 bpm. Exam Location:  [facility] Procedure: 2D Echo Indications:    NSTEMI I21.4  History:        Patient has prior history of Echocardiogram examinations, most                 recent 10/24/2019. CHF, Previous Myocardial Infarction, COPD,                 Arrythmias:Atrial Fibrillation, Signs/Symptoms:Dyspnea; Risk                 Factors:Hypertension, Diabetes, Dyslipidemia and Former Smoker.  Sonographer:    Leavy Cella RDCS (AE) Referring Phys: [phone] DAVID TAT IMPRESSIONS  1. The entire apex is akinetic. Essentially the entire mid to distal ventricle is hypokinetic, with normal function at the base. Pattern could be consistent with stress induced cardiomyopathy/Takotsubo CM, cannot exclude ischemic heart disease. . Left ventricular ejection fraction, by estimation, is 25 to 30%. The left ventricle has severely decreased function. The left ventricle demonstrates regional wall motion abnormalities (see scoring diagram/findings for description). Left ventricular diastolic parameters are indeterminate.  2. RV poorly visualized, grossly appears enlarged with mild systolic dysfunction. . Right ventricular systolic function was not well visualized. The right ventricular size is not well visualized.  3. The mitral valve is normal in structure. Trivial mitral valve regurgitation. No evidence of mitral stenosis.  4. Tricuspid valve regurgitation is moderate.  5. The aortic valve is tricuspid. Aortic valve regurgitation is not visualized. No aortic stenosis is present.  6. Mild pulmonary HTN, PASP is 38 mmHg.  7. The inferior vena cava is normal in size with greater than 50% respiratory variability, suggesting right atrial pressure of 3 mmHg. FINDINGS  Left Ventricle: The entire apex is akinetic. Essentially the entire mid  to distal ventricle is hypokinetic, with normal function at the base. Pattern could be consistent with stress induced cardiomyopathy/Takotsubo CM, cannot exclude ischemic heart disease. Left ventricular ejection fraction, by estimation, is 25 to 30%. The left ventricle has severely decreased function. The left ventricle demonstrates regional wall motion abnormalities. The left ventricular internal cavity size was normal in size. There is no left ventricular hypertrophy. Left ventricular diastolic parameters are indeterminate. Right Ventricle: RV poorly visualized, grossly appears enlarged with mild systolic dysfunction. The right ventricular size is not well visualized. Right vetricular wall thickness was not assessed. Right ventricular systolic function was not well visualized. The tricuspid regurgitant velocity is 2.84 m/s, and with an assumed right atrial pressure of 10 mmHg, the estimated right ventricular systolic pressure is 76.5 mmHg. Left Atrium: Left atrial size was normal in size. Right Atrium: Right atrial size  was normal in size. Pericardium: There is no evidence of pericardial effusion. Mitral Valve: The mitral valve is normal in structure. There is mild thickening of the mitral valve leaflet(s). There is mild calcification of the mitral valve leaflet(s). Moderate mitral annular calcification. Trivial mitral valve regurgitation. No evidence of mitral valve stenosis. Tricuspid Valve: The tricuspid valve is not well visualized. Tricuspid valve regurgitation is moderate . No evidence of tricuspid stenosis. Aortic Valve: The aortic valve is tricuspid. . There is moderate thickening and moderate calcification of the aortic valve. Aortic valve regurgitation is not visualized. No aortic stenosis is present. Moderate aortic valve annular calcification. There is  moderate thickening of the aortic valve. There is moderate calcification of the aortic valve. Aortic valve mean gradient measures 3.3 mmHg. Aortic  valve peak gradient measures 6.3 mmHg. Aortic valve area, by VTI measures 1.61 cm. Pulmonic Valve: The pulmonic valve was not well visualized. Pulmonic valve regurgitation is not visualized. No evidence of pulmonic stenosis. Aorta: The aortic root is normal in size and structure. Pulmonary Artery: Mild pulmonary HTN, PASP is 38 mmHg. Venous: The inferior vena cava is normal in size with greater than 50% respiratory variability, suggesting right atrial pressure of 3 mmHg. IAS/Shunts: No atrial level shunt detected by color flow Doppler.  LEFT VENTRICLE PLAX 2D LVOT diam:     1.90 cm  Diastology LV SV:         34       LV e' lateral:   7.28 cm/s LV SV Index:   19       LV E/e' lateral: 11.2 LVOT Area:     2.84 cm LV e' medial:    6.46 cm/s                         LV E/e' medial:  12.7  RIGHT VENTRICLE RV S prime:     6.18 cm/s TAPSE (M-mode): 0.9 cm LEFT ATRIUM             Index       RIGHT ATRIUM           Index LA Vol (A2C):   52.2 ml 28.97 ml/m RA Area:     12.50 cm LA Vol (A4C):   47.4 ml 26.31 ml/m RA Volume:   31.40 ml  17.43 ml/m LA Biplane Vol: 50.9 ml 28.25 ml/m  AORTIC VALVE AV Area (Vmax):    1.63 cm AV Area (Vmean):   1.51 cm AV Area (VTI):     1.61 cm AV Vmax:           125.08 cm/s AV Vmean:          84.286 cm/s AV VTI:            0.208 m AV Peak Grad:      6.3 mmHg AV Mean Grad:      3.3 mmHg LVOT Vmax:         71.98 cm/s LVOT Vmean:        44.777 cm/s LVOT VTI:          0.118 m LVOT/AV VTI ratio: 0.57 MITRAL VALVE               TRICUSPID VALVE MV Area (PHT): 3.63 cm    TR Peak grad:   32.3 mmHg MV Decel Time: 209 msec    TR Vmax:        284.00 cm/s MV E velocity: 81.80 cm/s MV A velocity:  25.70 cm/s  SHUNTS MV E/A ratio:  3.18        Systemic VTI:  0.12 m                            Systemic Diam: 1.90 cm Carlyle Dolly MD Electronically signed by Carlyle Dolly MD Signature Date/Time: 01/11/2020/12:03:20 PM    Final    [place] EKG SITE RITE  Result Date: 01/05/2020 If Site Rite image not  attached, placement could not be confirmed due to current cardiac rhythm.   Microbiology No results found for this or any previous visit (from the past 240 hour(s)).  Lab Basic Metabolic Panel: No results for input(s): NA, K, CL, CO2, GLUCOSE, BUN, CREATININE, CALCIUM, MG, PHOS in the last 168 hours. Liver Function Tests: No results for input(s): AST, ALT, ALKPHOS, BILITOT, PROT, ALBUMIN in the last 168 hours. No results for input(s): LIPASE, AMYLASE in the last 168 hours. No results for input(s): AMMONIA in the last 168 hours. CBC: No results for input(s): WBC, NEUTROABS, HGB, HCT, MCV, PLT in the last 168 hours. Cardiac Enzymes: No results for input(s): CKTOTAL, CKMB, CKMBINDEX, TROPONINI in the last 168 hours. Sepsis Labs: No results for input(s): PROCALCITON, WBC, LATICACIDVEN in the last 168 hours.  Procedures/Operations  TTE 12/26/2019 1. The entire apex is akinetic. Essentially the entire mid to distal  ventricle is hypokinetic, with normal function at the base. Pattern could  be consistent with stress induced cardiomyopathy/Takotsubo CM, cannot  exclude ischemic heart disease. . Left  ventricular ejection fraction, by estimation, is 25 to 30%. The left  ventricle has severely decreased function. The left ventricle demonstrates  regional wall motion abnormalities (see scoring diagram/findings for  description). Left ventricular diastolic  parameters are indeterminate.  2. RV poorly visualized, grossly appears enlarged with mild systolic  dysfunction. . Right ventricular systolic function was not well  visualized. The right ventricular size is not well visualized.  3. The mitral valve is normal in structure. Trivial mitral valve  regurgitation. No evidence of mitral stenosis.  4. Tricuspid valve regurgitation is moderate.  5. The aortic valve is tricuspid. Aortic valve regurgitation is not  visualized. No aortic stenosis is present.  6. Mild pulmonary HTN, PASP is 38  mmHg.  7. The inferior vena cava is normal in size with greater than 50%  respiratory variability, suggesting right atrial pressure of 3 mmHg.    Ronnae Kaser Martin Telesa Jeancharles 01/15/2020, 3:01 PM

## 2020-01-22 NOTE — Progress Notes (Signed)
We were notified that the patient passed away peacefully at 15:52 pm. She was under palliative care and comfortable. The family was notified. Death certificate was filled.   Cynthia Dawley, MD 11-Feb-2020

## 2020-01-22 DEATH — deceased

## 2020-01-24 ENCOUNTER — Institutional Professional Consult (permissible substitution): Payer: Medicare HMO | Admitting: Pulmonary Disease

## 2020-01-31 ENCOUNTER — Telehealth: Payer: Self-pay | Admitting: Cardiology

## 2020-01-31 NOTE — Telephone Encounter (Signed)
Original death certificate received from [facility] and completed by Dr. Meda Coffee. Death certificate placed in envelope provided by funeral home and mailed to [facility] Dept at below address. 01/31/20 [place] Dept Attn: Death Certificates [address], [place], [place] [address]

## 2020-02-05 NOTE — Telephone Encounter (Signed)
error
